# Patient Record
Sex: Male | Born: 1935 | Race: White | Hispanic: No | Marital: Married | Smoking: Never smoker
Health system: Southern US, Community
[De-identification: ages and names within clinical notes are randomized; demographics above are authoritative.]

## PROBLEM LIST (undated history)

## (undated) DIAGNOSIS — J309 Allergic rhinitis, unspecified: Secondary | ICD-10-CM

## (undated) DIAGNOSIS — I480 Paroxysmal atrial fibrillation: Secondary | ICD-10-CM

## (undated) DIAGNOSIS — I10 Essential (primary) hypertension: Secondary | ICD-10-CM

## (undated) DIAGNOSIS — E785 Hyperlipidemia, unspecified: Secondary | ICD-10-CM

## (undated) DIAGNOSIS — I509 Heart failure, unspecified: Secondary | ICD-10-CM

## (undated) DIAGNOSIS — N400 Enlarged prostate without lower urinary tract symptoms: Secondary | ICD-10-CM

## (undated) DIAGNOSIS — I2510 Atherosclerotic heart disease of native coronary artery without angina pectoris: Secondary | ICD-10-CM

## (undated) HISTORY — DX: Hyperlipidemia, unspecified: E78.5

## (undated) HISTORY — DX: Essential (primary) hypertension: I10

## (undated) HISTORY — DX: Atherosclerotic heart disease of native coronary artery without angina pectoris: I25.10

## (undated) HISTORY — DX: Paroxysmal atrial fibrillation: I48.0

## (undated) HISTORY — DX: Benign prostatic hyperplasia without lower urinary tract symptoms: N40.0

## (undated) HISTORY — DX: Allergic rhinitis, unspecified: J30.9

---
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
## 2009-04-26 DIAGNOSIS — I2510 Atherosclerotic heart disease of native coronary artery without angina pectoris: Secondary | ICD-10-CM

## 2009-04-26 HISTORY — DX: Atherosclerotic heart disease of native coronary artery without angina pectoris: I25.10

## 2009-04-26 HISTORY — PX: CARDIAC CATHETERIZATION: SHX172

## 2010-06-15 HISTORY — PX: COLONOSCOPY: SHX174

## 2011-05-04 DIAGNOSIS — L719 Rosacea, unspecified: Secondary | ICD-10-CM | POA: Diagnosis not present

## 2011-05-04 DIAGNOSIS — L570 Actinic keratosis: Secondary | ICD-10-CM | POA: Diagnosis not present

## 2011-05-04 DIAGNOSIS — L820 Inflamed seborrheic keratosis: Secondary | ICD-10-CM | POA: Diagnosis not present

## 2011-05-19 DIAGNOSIS — M5030 Other cervical disc degeneration, unspecified cervical region: Secondary | ICD-10-CM | POA: Diagnosis not present

## 2011-05-19 DIAGNOSIS — M5020 Other cervical disc displacement, unspecified cervical region: Secondary | ICD-10-CM | POA: Diagnosis not present

## 2011-05-19 DIAGNOSIS — M999 Biomechanical lesion, unspecified: Secondary | ICD-10-CM | POA: Diagnosis not present

## 2011-07-21 DIAGNOSIS — R55 Syncope and collapse: Secondary | ICD-10-CM | POA: Diagnosis not present

## 2011-07-21 DIAGNOSIS — I4891 Unspecified atrial fibrillation: Secondary | ICD-10-CM | POA: Diagnosis not present

## 2011-08-24 DIAGNOSIS — I4891 Unspecified atrial fibrillation: Secondary | ICD-10-CM | POA: Diagnosis not present

## 2011-08-25 DIAGNOSIS — M5030 Other cervical disc degeneration, unspecified cervical region: Secondary | ICD-10-CM | POA: Diagnosis not present

## 2011-08-25 DIAGNOSIS — M999 Biomechanical lesion, unspecified: Secondary | ICD-10-CM | POA: Diagnosis not present

## 2011-08-25 DIAGNOSIS — M5020 Other cervical disc displacement, unspecified cervical region: Secondary | ICD-10-CM | POA: Diagnosis not present

## 2011-08-25 DIAGNOSIS — I4891 Unspecified atrial fibrillation: Secondary | ICD-10-CM | POA: Diagnosis not present

## 2011-09-10 DIAGNOSIS — M999 Biomechanical lesion, unspecified: Secondary | ICD-10-CM | POA: Diagnosis not present

## 2011-09-10 DIAGNOSIS — M5030 Other cervical disc degeneration, unspecified cervical region: Secondary | ICD-10-CM | POA: Diagnosis not present

## 2011-09-10 DIAGNOSIS — M5020 Other cervical disc displacement, unspecified cervical region: Secondary | ICD-10-CM | POA: Diagnosis not present

## 2011-09-13 DIAGNOSIS — M79609 Pain in unspecified limb: Secondary | ICD-10-CM | POA: Diagnosis not present

## 2011-09-13 DIAGNOSIS — B351 Tinea unguium: Secondary | ICD-10-CM | POA: Diagnosis not present

## 2011-09-13 DIAGNOSIS — L600 Ingrowing nail: Secondary | ICD-10-CM | POA: Diagnosis not present

## 2011-09-13 DIAGNOSIS — L608 Other nail disorders: Secondary | ICD-10-CM | POA: Diagnosis not present

## 2011-09-15 DIAGNOSIS — H43819 Vitreous degeneration, unspecified eye: Secondary | ICD-10-CM | POA: Diagnosis not present

## 2011-09-15 DIAGNOSIS — H25049 Posterior subcapsular polar age-related cataract, unspecified eye: Secondary | ICD-10-CM | POA: Diagnosis not present

## 2011-09-15 DIAGNOSIS — H2510 Age-related nuclear cataract, unspecified eye: Secondary | ICD-10-CM | POA: Diagnosis not present

## 2011-09-15 DIAGNOSIS — H02839 Dermatochalasis of unspecified eye, unspecified eyelid: Secondary | ICD-10-CM | POA: Diagnosis not present

## 2011-09-27 DIAGNOSIS — L608 Other nail disorders: Secondary | ICD-10-CM | POA: Diagnosis not present

## 2011-09-27 DIAGNOSIS — L600 Ingrowing nail: Secondary | ICD-10-CM | POA: Diagnosis not present

## 2011-09-27 DIAGNOSIS — M79609 Pain in unspecified limb: Secondary | ICD-10-CM | POA: Diagnosis not present

## 2011-09-27 DIAGNOSIS — B351 Tinea unguium: Secondary | ICD-10-CM | POA: Diagnosis not present

## 2011-10-04 DIAGNOSIS — I498 Other specified cardiac arrhythmias: Secondary | ICD-10-CM | POA: Diagnosis not present

## 2011-10-04 DIAGNOSIS — I4891 Unspecified atrial fibrillation: Secondary | ICD-10-CM | POA: Diagnosis not present

## 2011-10-04 DIAGNOSIS — R55 Syncope and collapse: Secondary | ICD-10-CM | POA: Diagnosis not present

## 2011-10-11 DIAGNOSIS — Z6826 Body mass index (BMI) 26.0-26.9, adult: Secondary | ICD-10-CM | POA: Diagnosis not present

## 2011-10-11 DIAGNOSIS — H532 Diplopia: Secondary | ICD-10-CM | POA: Diagnosis not present

## 2011-10-11 DIAGNOSIS — H8110 Benign paroxysmal vertigo, unspecified ear: Secondary | ICD-10-CM | POA: Diagnosis not present

## 2011-10-12 DIAGNOSIS — R42 Dizziness and giddiness: Secondary | ICD-10-CM | POA: Diagnosis not present

## 2011-10-12 DIAGNOSIS — H532 Diplopia: Secondary | ICD-10-CM | POA: Diagnosis not present

## 2011-12-29 DIAGNOSIS — I4891 Unspecified atrial fibrillation: Secondary | ICD-10-CM | POA: Diagnosis not present

## 2011-12-29 DIAGNOSIS — I10 Essential (primary) hypertension: Secondary | ICD-10-CM | POA: Diagnosis not present

## 2011-12-29 DIAGNOSIS — I498 Other specified cardiac arrhythmias: Secondary | ICD-10-CM | POA: Diagnosis not present

## 2012-01-17 DIAGNOSIS — Z23 Encounter for immunization: Secondary | ICD-10-CM | POA: Diagnosis not present

## 2012-01-26 DIAGNOSIS — M999 Biomechanical lesion, unspecified: Secondary | ICD-10-CM | POA: Diagnosis not present

## 2012-01-26 DIAGNOSIS — M5020 Other cervical disc displacement, unspecified cervical region: Secondary | ICD-10-CM | POA: Diagnosis not present

## 2012-01-26 DIAGNOSIS — M5030 Other cervical disc degeneration, unspecified cervical region: Secondary | ICD-10-CM | POA: Diagnosis not present

## 2012-04-27 DIAGNOSIS — M5030 Other cervical disc degeneration, unspecified cervical region: Secondary | ICD-10-CM | POA: Diagnosis not present

## 2012-04-27 DIAGNOSIS — M542 Cervicalgia: Secondary | ICD-10-CM | POA: Diagnosis not present

## 2012-04-28 DIAGNOSIS — M542 Cervicalgia: Secondary | ICD-10-CM | POA: Diagnosis not present

## 2012-04-28 DIAGNOSIS — M5030 Other cervical disc degeneration, unspecified cervical region: Secondary | ICD-10-CM | POA: Diagnosis not present

## 2012-05-08 DIAGNOSIS — L600 Ingrowing nail: Secondary | ICD-10-CM | POA: Diagnosis not present

## 2012-05-08 DIAGNOSIS — M25473 Effusion, unspecified ankle: Secondary | ICD-10-CM | POA: Diagnosis not present

## 2012-05-08 DIAGNOSIS — M79609 Pain in unspecified limb: Secondary | ICD-10-CM | POA: Diagnosis not present

## 2012-05-08 DIAGNOSIS — M25476 Effusion, unspecified foot: Secondary | ICD-10-CM | POA: Diagnosis not present

## 2012-05-11 DIAGNOSIS — M542 Cervicalgia: Secondary | ICD-10-CM | POA: Diagnosis not present

## 2012-05-11 DIAGNOSIS — M5030 Other cervical disc degeneration, unspecified cervical region: Secondary | ICD-10-CM | POA: Diagnosis not present

## 2012-05-22 DIAGNOSIS — M25476 Effusion, unspecified foot: Secondary | ICD-10-CM | POA: Diagnosis not present

## 2012-05-22 DIAGNOSIS — M79609 Pain in unspecified limb: Secondary | ICD-10-CM | POA: Diagnosis not present

## 2012-05-22 DIAGNOSIS — M25473 Effusion, unspecified ankle: Secondary | ICD-10-CM | POA: Diagnosis not present

## 2012-05-22 DIAGNOSIS — L600 Ingrowing nail: Secondary | ICD-10-CM | POA: Diagnosis not present

## 2012-05-26 DIAGNOSIS — R002 Palpitations: Secondary | ICD-10-CM | POA: Diagnosis not present

## 2012-05-26 DIAGNOSIS — I4891 Unspecified atrial fibrillation: Secondary | ICD-10-CM | POA: Diagnosis not present

## 2012-05-26 DIAGNOSIS — I209 Angina pectoris, unspecified: Secondary | ICD-10-CM | POA: Diagnosis not present

## 2012-05-26 DIAGNOSIS — I498 Other specified cardiac arrhythmias: Secondary | ICD-10-CM | POA: Diagnosis not present

## 2012-05-26 DIAGNOSIS — N400 Enlarged prostate without lower urinary tract symptoms: Secondary | ICD-10-CM | POA: Diagnosis not present

## 2012-05-26 DIAGNOSIS — I4892 Unspecified atrial flutter: Secondary | ICD-10-CM | POA: Diagnosis not present

## 2012-05-26 DIAGNOSIS — M129 Arthropathy, unspecified: Secondary | ICD-10-CM | POA: Diagnosis not present

## 2012-05-26 DIAGNOSIS — I509 Heart failure, unspecified: Secondary | ICD-10-CM | POA: Diagnosis not present

## 2012-05-26 DIAGNOSIS — I495 Sick sinus syndrome: Secondary | ICD-10-CM | POA: Diagnosis not present

## 2012-06-05 DIAGNOSIS — M47812 Spondylosis without myelopathy or radiculopathy, cervical region: Secondary | ICD-10-CM | POA: Diagnosis not present

## 2012-06-05 DIAGNOSIS — M5030 Other cervical disc degeneration, unspecified cervical region: Secondary | ICD-10-CM | POA: Diagnosis not present

## 2012-06-05 DIAGNOSIS — I498 Other specified cardiac arrhythmias: Secondary | ICD-10-CM | POA: Diagnosis not present

## 2012-06-05 DIAGNOSIS — M542 Cervicalgia: Secondary | ICD-10-CM | POA: Diagnosis not present

## 2012-06-05 DIAGNOSIS — Z0000 Encounter for general adult medical examination without abnormal findings: Secondary | ICD-10-CM | POA: Diagnosis not present

## 2012-06-05 DIAGNOSIS — Z13220 Encounter for screening for lipoid disorders: Secondary | ICD-10-CM | POA: Diagnosis not present

## 2012-06-05 DIAGNOSIS — I4891 Unspecified atrial fibrillation: Secondary | ICD-10-CM | POA: Diagnosis not present

## 2012-06-12 DIAGNOSIS — I4891 Unspecified atrial fibrillation: Secondary | ICD-10-CM | POA: Diagnosis not present

## 2012-06-12 DIAGNOSIS — E785 Hyperlipidemia, unspecified: Secondary | ICD-10-CM | POA: Diagnosis not present

## 2012-06-12 DIAGNOSIS — I10 Essential (primary) hypertension: Secondary | ICD-10-CM | POA: Diagnosis not present

## 2012-06-19 DIAGNOSIS — M5030 Other cervical disc degeneration, unspecified cervical region: Secondary | ICD-10-CM | POA: Diagnosis not present

## 2012-06-19 DIAGNOSIS — M542 Cervicalgia: Secondary | ICD-10-CM | POA: Diagnosis not present

## 2012-07-04 DIAGNOSIS — M47812 Spondylosis without myelopathy or radiculopathy, cervical region: Secondary | ICD-10-CM | POA: Diagnosis not present

## 2012-07-26 DIAGNOSIS — M542 Cervicalgia: Secondary | ICD-10-CM | POA: Diagnosis not present

## 2012-07-26 DIAGNOSIS — M5030 Other cervical disc degeneration, unspecified cervical region: Secondary | ICD-10-CM | POA: Diagnosis not present

## 2012-08-02 DIAGNOSIS — Z125 Encounter for screening for malignant neoplasm of prostate: Secondary | ICD-10-CM | POA: Diagnosis not present

## 2012-08-02 DIAGNOSIS — Z13220 Encounter for screening for lipoid disorders: Secondary | ICD-10-CM | POA: Diagnosis not present

## 2012-08-02 DIAGNOSIS — Z0000 Encounter for general adult medical examination without abnormal findings: Secondary | ICD-10-CM | POA: Diagnosis not present

## 2012-08-02 DIAGNOSIS — N400 Enlarged prostate without lower urinary tract symptoms: Secondary | ICD-10-CM | POA: Diagnosis not present

## 2012-08-02 DIAGNOSIS — I10 Essential (primary) hypertension: Secondary | ICD-10-CM | POA: Diagnosis not present

## 2012-08-02 DIAGNOSIS — I4891 Unspecified atrial fibrillation: Secondary | ICD-10-CM | POA: Diagnosis not present

## 2012-08-23 DIAGNOSIS — I4891 Unspecified atrial fibrillation: Secondary | ICD-10-CM | POA: Diagnosis not present

## 2012-08-23 DIAGNOSIS — M47812 Spondylosis without myelopathy or radiculopathy, cervical region: Secondary | ICD-10-CM | POA: Diagnosis not present

## 2012-08-23 DIAGNOSIS — M5030 Other cervical disc degeneration, unspecified cervical region: Secondary | ICD-10-CM | POA: Diagnosis not present

## 2012-08-23 DIAGNOSIS — M542 Cervicalgia: Secondary | ICD-10-CM | POA: Diagnosis not present

## 2012-08-23 HISTORY — PX: RADIOFREQUENCY ABLATION: SHX2290

## 2012-09-04 DIAGNOSIS — Z111 Encounter for screening for respiratory tuberculosis: Secondary | ICD-10-CM | POA: Diagnosis not present

## 2012-09-06 DIAGNOSIS — M542 Cervicalgia: Secondary | ICD-10-CM | POA: Diagnosis not present

## 2012-09-06 DIAGNOSIS — M5030 Other cervical disc degeneration, unspecified cervical region: Secondary | ICD-10-CM | POA: Diagnosis not present

## 2012-09-11 DIAGNOSIS — I4891 Unspecified atrial fibrillation: Secondary | ICD-10-CM | POA: Diagnosis not present

## 2012-09-11 DIAGNOSIS — I10 Essential (primary) hypertension: Secondary | ICD-10-CM | POA: Diagnosis not present

## 2012-09-11 DIAGNOSIS — E7800 Pure hypercholesterolemia, unspecified: Secondary | ICD-10-CM | POA: Diagnosis not present

## 2012-12-28 DIAGNOSIS — M542 Cervicalgia: Secondary | ICD-10-CM | POA: Diagnosis not present

## 2012-12-28 DIAGNOSIS — M25519 Pain in unspecified shoulder: Secondary | ICD-10-CM | POA: Diagnosis not present

## 2013-01-10 ENCOUNTER — Emergency Department (HOSPITAL_COMMUNITY)
Admission: EM | Admit: 2013-01-10 | Discharge: 2013-01-10 | Disposition: A | Discharge status: Home / Self Care | Payer: Medicare Other | Attending: Emergency Medicine | Admitting: Emergency Medicine

## 2013-01-10 ENCOUNTER — Encounter (HOSPITAL_COMMUNITY): Payer: Self-pay | Admitting: *Deleted

## 2013-01-10 ENCOUNTER — Emergency Department (HOSPITAL_COMMUNITY): Payer: Medicare Other

## 2013-01-10 DIAGNOSIS — Z7982 Long term (current) use of aspirin: Secondary | ICD-10-CM | POA: Insufficient documentation

## 2013-01-10 DIAGNOSIS — I4891 Unspecified atrial fibrillation: Secondary | ICD-10-CM | POA: Insufficient documentation

## 2013-01-10 DIAGNOSIS — I509 Heart failure, unspecified: Secondary | ICD-10-CM | POA: Insufficient documentation

## 2013-01-10 DIAGNOSIS — Z79899 Other long term (current) drug therapy: Secondary | ICD-10-CM | POA: Insufficient documentation

## 2013-01-10 DIAGNOSIS — R072 Precordial pain: Secondary | ICD-10-CM | POA: Insufficient documentation

## 2013-01-10 DIAGNOSIS — R002 Palpitations: Secondary | ICD-10-CM | POA: Insufficient documentation

## 2013-01-10 DIAGNOSIS — R079 Chest pain, unspecified: Secondary | ICD-10-CM

## 2013-01-10 HISTORY — DX: Heart failure, unspecified: I50.9

## 2013-01-10 LAB — CBC WITH DIFFERENTIAL/PLATELET
Basophils Relative: 0 % (ref 0–1)
HCT: 40.4 % (ref 39.0–52.0)
Hemoglobin: 14 g/dL (ref 13.0–17.0)
Lymphocytes Relative: 31 % (ref 12–46)
MCHC: 34.7 g/dL (ref 30.0–36.0)
Monocytes Relative: 10 % (ref 3–12)
Neutro Abs: 3.1 10*3/uL (ref 1.7–7.7)
Neutrophils Relative %: 58 % (ref 43–77)
RBC: 4.48 MIL/uL (ref 4.22–5.81)
WBC: 5.4 10*3/uL (ref 4.0–10.5)

## 2013-01-10 LAB — TROPONIN I: Troponin I: <0.3 ng/mL (ref <0.30)

## 2013-01-10 LAB — COMPREHENSIVE METABOLIC PANEL
AST: 26 U/L (ref 0–37)
Albumin: 4.1 g/dL (ref 3.5–5.2)
Alkaline Phosphatase: 67 U/L (ref 39–117)
BUN: 24 mg/dL — ABNORMAL HIGH (ref 6–23)
CO2: 29 mEq/L (ref 19–32)
Chloride: 101 mEq/L (ref 96–112)
GFR calc non Af Amer: 60 mL/min — ABNORMAL LOW (ref >90)
Potassium: 3.9 mEq/L (ref 3.5–5.1)
Total Bilirubin: 0.3 mg/dL (ref 0.3–1.2)

## 2013-01-10 NOTE — ED Notes (Signed)
MD at bedside. 

## 2013-01-10 NOTE — ED Notes (Signed)
2 hours ago the pt was having some chest tithtness and his heart was racing.  Hx of the same.  No pain at present and his heart has slowed down

## 2013-01-10 NOTE — ED Provider Notes (Signed)
CSN: [id number]     Arrival date & time 01/10/13  2101 History   First MD Initiated Contact with Patient 01/10/13 2153     Chief Complaint  Patient presents with  . Chest Pain   (Consider location/radiation/quality/duration/timing/severity/associated sxs/prior Treatment) Patient is a 79 y.o. male presenting with chest pain. The history is provided by the patient.  Chest Pain Pain location:  Substernal area Pain quality comment:  Tightness Pain radiates to:  Does not radiate Pain radiates to the back: no   Pain severity:  Mild Onset quality:  Sudden Duration:  30 minutes Timing:  Constant Progression:  Resolved Chronicity:  New Context: at rest   Relieved by:  Nothing Worsened by:  Nothing tried Ineffective treatments:  None tried Associated symptoms: palpitations   Associated symptoms: no abdominal pain, no anxiety, no back pain, no cough, no diaphoresis, no dizziness, no dysphagia, no fatigue, no fever, no headache, no nausea, no near-syncope, no numbness, no shortness of breath, not vomiting and no weakness   Associated symptoms comment:  2 hours of tachy, irregular palpitations Risk factors: male sex   Risk factors: no coronary artery disease, no diabetes mellitus, no high cholesterol, no hypertension and no smoking     Past Medical History  Diagnosis Date  . CHF (congestive heart failure)   . AF (atrial fibrillation)    History reviewed. No pertinent past surgical history. History reviewed. No pertinent family history. History  Substance Use Topics  . Smoking status: Never Smoker   . Smokeless tobacco: Not on file  . Alcohol Use: Yes    Review of Systems  Constitutional: Negative for fever, diaphoresis, activity change, appetite change and fatigue.  HENT: Negative for congestion, facial swelling, rhinorrhea and trouble swallowing.   Eyes: Negative for photophobia and pain.  Respiratory: Negative for cough, chest tightness and shortness of breath.    Cardiovascular: Positive for chest pain and palpitations. Negative for leg swelling and near-syncope.  Gastrointestinal: Negative for nausea, vomiting, abdominal pain, diarrhea and constipation.  Endocrine: Negative for polydipsia and polyuria.  Genitourinary: Negative for dysuria, urgency, decreased urine volume and difficulty urinating.  Musculoskeletal: Negative for back pain and gait problem.  Skin: Negative for color change, rash and wound.  Allergic/Immunologic: Negative for immunocompromised state.  Neurological: Negative for dizziness, facial asymmetry, speech difficulty, weakness, numbness and headaches.  Psychiatric/Behavioral: Negative for confusion, decreased concentration and agitation.    Allergies  Keflex  Home Medications   Current Outpatient Rx  Name  Route  Sig  Dispense  Refill  . aspirin EC 325 MG tablet   Oral   Take 325 mg by mouth daily.         . B Complex-C (SUPER B COMPLEX/VITAMIN C) TABS   Oral   Take 1 tablet by mouth daily.         . carboxymethylcellulose 1 % ophthalmic solution   Ophthalmic   Apply 1 drop to eye every morning.         . cetirizine (ZYRTEC) 10 MG tablet   Oral   Take 10 mg by mouth daily.         . cyclobenzaprine (FLEXERIL) 10 MG tablet   Oral   Take 10 mg by mouth at bedtime.         . diphenhydramine-acetaminophen (TYLENOL PM) 25-500 MG TABS   Oral   Take 2 tablets by mouth at bedtime.         . docusate sodium (COLACE) 100 MG capsule  Oral   Take 100 mg by mouth 2 (two) times daily.         . finasteride (PROSCAR) 5 MG tablet   Oral   Take 5 mg by mouth daily.         . hydrocortisone cream 1 %   Topical   Apply 1 application topically daily.         . magnesium oxide (MAG-OX) 400 MG tablet   Oral   Take 400 mg by mouth 2 (two) times daily.         . Multiple Vitamin (MULTIVITAMIN WITH MINERALS) TABS tablet   Oral   Take 1 tablet by mouth daily.         . naproxen sodium (ANAPROX)  220 MG tablet   Oral   Take 220 mg by mouth daily.         . potassium gluconate 595 MG TABS tablet   Oral   Take 595 mg by mouth daily.         . sotalol (BETAPACE) 80 MG tablet   Oral   Take 40 mg by mouth 2 (two) times daily.          BP 122/67  Pulse 64  Temp(Src) 97.7 F (36.5 C) (Oral)  Resp 17  SpO2 98% Physical Exam  Constitutional: He is oriented to person, place, and time. He appears well-developed and well-nourished. No distress.  HENT:  Head: Normocephalic and atraumatic.  Mouth/Throat: No oropharyngeal exudate.  Eyes: Pupils are equal, round, and reactive to light.  Neck: Normal range of motion. Neck supple.  Cardiovascular: Normal rate, regular rhythm and normal heart sounds.  Exam reveals no gallop and no friction rub.   No murmur heard. Pulmonary/Chest: Effort normal and breath sounds normal. No respiratory distress. He has no wheezes. He has no rales.  Abdominal: Soft. Bowel sounds are normal. He exhibits no distension and no mass. There is no tenderness. There is no rebound and no guarding.  Musculoskeletal: Normal range of motion. He exhibits no edema and no tenderness.  Neurological: He is alert and oriented to person, place, and time.  Skin: Skin is warm and dry.  Psychiatric: He has a normal mood and affect.    ED Course  Procedures (including critical care time) Labs Review Labs Reviewed  COMPREHENSIVE METABOLIC PANEL - Abnormal; Notable for the following:    Glucose, Bld 128 (*)    BUN 24 (*)    GFR calc non Af Amer 60 (*)    GFR calc Af Amer 69 (*)    All other components within normal limits  CBC WITH DIFFERENTIAL  TROPONIN I   Imaging Review Dg Chest 2 View  01/10/2013   CLINICAL DATA:  Chest tightness. History of congestive heart failure.  EXAM: CHEST  2 VIEW  COMPARISON:  No priors.  FINDINGS: Normal lung volumes. No acute consolidative airspace disease. No pleural effusions. No evidence of pulmonary edema. Heart size is normal.  Mediastinal contours are unremarkable. Atherosclerosis in the thoracic aorta. Mild bilateral apical pleural parenchymal thickening, presumably post infectious or inflammatory scarring.  IMPRESSION: 1. No radiographic evidence of acute cardiopulmonary disease. 2. Atherosclerosis.   Electronically Signed   By: Trudie Reed M.D.   On: 01/10/2013 22:53     Date: 01/10/2013  Rate: 72  Rhythm: normal sinus rhythm  QRS Axis: normal  Intervals: normal  ST/T Wave abnormalities: normal  Conduction Disutrbances: none  Narrative Interpretation: likely old anterolateral infarct w/ precordial Q waves.  RA enlargement. No prior seen.     MDM   1. Chest pain   2. Palpitations    Pt is a 79 y.o. male with Pmhx as above who presents with about 2 hrs of tachy arrythmia and 30 mins of CP, beginning around 7pm tonight, now resolved.  Hx of afib with similar symptoms in past, though not typically with associated CP.  He also reports having heart cather several years ago with greatest blockage being 20%.  EKG NSR as above, trop not elevated, CXR unremarkable.  Pt now has no complaints, has not had reoccurrence of symptoms, and would like to go home.  Have spoken with cardiology, Dr. Chase Picket about pt's hx, course and w/u, he agrees that he should be safe for d/c, but need to see his cardiologist, Dr. Mayford Knife in 1-2 days.  Pt will return or ED for symptom reoccurrence.     1. Chest pain   2. Palpitations         Shanna Cisco, MD 01/11/13 [phone]

## 2013-03-01 DIAGNOSIS — D235 Other benign neoplasm of skin of trunk: Secondary | ICD-10-CM | POA: Diagnosis not present

## 2013-03-01 DIAGNOSIS — L570 Actinic keratosis: Secondary | ICD-10-CM | POA: Diagnosis not present

## 2013-03-08 ENCOUNTER — Telehealth: Payer: Self-pay | Admitting: *Deleted

## 2013-03-08 MED ORDER — SOTALOL HCL 80 MG PO TABS: 40.0000 mg | ORAL_TABLET | Freq: Two times a day (BID) | ORAL | Status: DC

## 2013-03-08 NOTE — Telephone Encounter (Signed)
Refill request for sotalol hcl 80mg , 0.5 tab bid to be sent to [facility] pharmacy.

## 2013-03-08 NOTE — Telephone Encounter (Signed)
Rx sent in for pt.

## 2013-05-29 DIAGNOSIS — K5900 Constipation, unspecified: Secondary | ICD-10-CM | POA: Diagnosis not present

## 2013-05-29 DIAGNOSIS — J309 Allergic rhinitis, unspecified: Secondary | ICD-10-CM | POA: Diagnosis not present

## 2013-08-01 DIAGNOSIS — M9981 Other biomechanical lesions of cervical region: Secondary | ICD-10-CM | POA: Diagnosis not present

## 2013-08-01 DIAGNOSIS — M545 Low back pain, unspecified: Secondary | ICD-10-CM | POA: Diagnosis not present

## 2013-08-01 DIAGNOSIS — M5030 Other cervical disc degeneration, unspecified cervical region: Secondary | ICD-10-CM | POA: Diagnosis not present

## 2013-08-01 DIAGNOSIS — M999 Biomechanical lesion, unspecified: Secondary | ICD-10-CM | POA: Diagnosis not present

## 2013-08-02 DIAGNOSIS — M545 Low back pain, unspecified: Secondary | ICD-10-CM | POA: Diagnosis not present

## 2013-08-02 DIAGNOSIS — M999 Biomechanical lesion, unspecified: Secondary | ICD-10-CM | POA: Diagnosis not present

## 2013-08-02 DIAGNOSIS — M5030 Other cervical disc degeneration, unspecified cervical region: Secondary | ICD-10-CM | POA: Diagnosis not present

## 2013-08-02 DIAGNOSIS — M9981 Other biomechanical lesions of cervical region: Secondary | ICD-10-CM | POA: Diagnosis not present

## 2013-08-07 DIAGNOSIS — M9981 Other biomechanical lesions of cervical region: Secondary | ICD-10-CM | POA: Diagnosis not present

## 2013-08-07 DIAGNOSIS — M5030 Other cervical disc degeneration, unspecified cervical region: Secondary | ICD-10-CM | POA: Diagnosis not present

## 2013-08-07 DIAGNOSIS — M545 Low back pain, unspecified: Secondary | ICD-10-CM | POA: Diagnosis not present

## 2013-08-07 DIAGNOSIS — M999 Biomechanical lesion, unspecified: Secondary | ICD-10-CM | POA: Diagnosis not present

## 2013-08-10 DIAGNOSIS — M999 Biomechanical lesion, unspecified: Secondary | ICD-10-CM | POA: Diagnosis not present

## 2013-08-10 DIAGNOSIS — M545 Low back pain, unspecified: Secondary | ICD-10-CM | POA: Diagnosis not present

## 2013-08-10 DIAGNOSIS — M9981 Other biomechanical lesions of cervical region: Secondary | ICD-10-CM | POA: Diagnosis not present

## 2013-08-10 DIAGNOSIS — M5030 Other cervical disc degeneration, unspecified cervical region: Secondary | ICD-10-CM | POA: Diagnosis not present

## 2013-08-13 DIAGNOSIS — M999 Biomechanical lesion, unspecified: Secondary | ICD-10-CM | POA: Diagnosis not present

## 2013-08-13 DIAGNOSIS — M545 Low back pain, unspecified: Secondary | ICD-10-CM | POA: Diagnosis not present

## 2013-08-13 DIAGNOSIS — M9981 Other biomechanical lesions of cervical region: Secondary | ICD-10-CM | POA: Diagnosis not present

## 2013-08-13 DIAGNOSIS — M5030 Other cervical disc degeneration, unspecified cervical region: Secondary | ICD-10-CM | POA: Diagnosis not present

## 2013-08-17 DIAGNOSIS — M9981 Other biomechanical lesions of cervical region: Secondary | ICD-10-CM | POA: Diagnosis not present

## 2013-08-17 DIAGNOSIS — M545 Low back pain, unspecified: Secondary | ICD-10-CM | POA: Diagnosis not present

## 2013-08-17 DIAGNOSIS — M5030 Other cervical disc degeneration, unspecified cervical region: Secondary | ICD-10-CM | POA: Diagnosis not present

## 2013-08-17 DIAGNOSIS — M999 Biomechanical lesion, unspecified: Secondary | ICD-10-CM | POA: Diagnosis not present

## 2013-08-21 DIAGNOSIS — M545 Low back pain, unspecified: Secondary | ICD-10-CM | POA: Diagnosis not present

## 2013-08-21 DIAGNOSIS — M5030 Other cervical disc degeneration, unspecified cervical region: Secondary | ICD-10-CM | POA: Diagnosis not present

## 2013-08-21 DIAGNOSIS — M999 Biomechanical lesion, unspecified: Secondary | ICD-10-CM | POA: Diagnosis not present

## 2013-08-21 DIAGNOSIS — M9981 Other biomechanical lesions of cervical region: Secondary | ICD-10-CM | POA: Diagnosis not present

## 2013-08-22 DIAGNOSIS — L570 Actinic keratosis: Secondary | ICD-10-CM | POA: Diagnosis not present

## 2013-08-22 DIAGNOSIS — D235 Other benign neoplasm of skin of trunk: Secondary | ICD-10-CM | POA: Diagnosis not present

## 2013-08-24 DIAGNOSIS — M5030 Other cervical disc degeneration, unspecified cervical region: Secondary | ICD-10-CM | POA: Diagnosis not present

## 2013-08-24 DIAGNOSIS — M9981 Other biomechanical lesions of cervical region: Secondary | ICD-10-CM | POA: Diagnosis not present

## 2013-08-24 DIAGNOSIS — M545 Low back pain, unspecified: Secondary | ICD-10-CM | POA: Diagnosis not present

## 2013-08-24 DIAGNOSIS — M999 Biomechanical lesion, unspecified: Secondary | ICD-10-CM | POA: Diagnosis not present

## 2013-08-27 DIAGNOSIS — J309 Allergic rhinitis, unspecified: Secondary | ICD-10-CM | POA: Diagnosis not present

## 2013-08-28 DIAGNOSIS — M9981 Other biomechanical lesions of cervical region: Secondary | ICD-10-CM | POA: Diagnosis not present

## 2013-08-28 DIAGNOSIS — M5030 Other cervical disc degeneration, unspecified cervical region: Secondary | ICD-10-CM | POA: Diagnosis not present

## 2013-08-28 DIAGNOSIS — M545 Low back pain, unspecified: Secondary | ICD-10-CM | POA: Diagnosis not present

## 2013-08-28 DIAGNOSIS — M999 Biomechanical lesion, unspecified: Secondary | ICD-10-CM | POA: Diagnosis not present

## 2013-08-31 DIAGNOSIS — M9981 Other biomechanical lesions of cervical region: Secondary | ICD-10-CM | POA: Diagnosis not present

## 2013-08-31 DIAGNOSIS — M999 Biomechanical lesion, unspecified: Secondary | ICD-10-CM | POA: Diagnosis not present

## 2013-08-31 DIAGNOSIS — M545 Low back pain, unspecified: Secondary | ICD-10-CM | POA: Diagnosis not present

## 2013-08-31 DIAGNOSIS — M5030 Other cervical disc degeneration, unspecified cervical region: Secondary | ICD-10-CM | POA: Diagnosis not present

## 2013-09-07 DIAGNOSIS — M9981 Other biomechanical lesions of cervical region: Secondary | ICD-10-CM | POA: Diagnosis not present

## 2013-09-07 DIAGNOSIS — M545 Low back pain, unspecified: Secondary | ICD-10-CM | POA: Diagnosis not present

## 2013-09-07 DIAGNOSIS — M999 Biomechanical lesion, unspecified: Secondary | ICD-10-CM | POA: Diagnosis not present

## 2013-09-07 DIAGNOSIS — M5030 Other cervical disc degeneration, unspecified cervical region: Secondary | ICD-10-CM | POA: Diagnosis not present

## 2013-09-11 DIAGNOSIS — M999 Biomechanical lesion, unspecified: Secondary | ICD-10-CM | POA: Diagnosis not present

## 2013-09-11 DIAGNOSIS — M5030 Other cervical disc degeneration, unspecified cervical region: Secondary | ICD-10-CM | POA: Diagnosis not present

## 2013-09-11 DIAGNOSIS — M9981 Other biomechanical lesions of cervical region: Secondary | ICD-10-CM | POA: Diagnosis not present

## 2013-09-11 DIAGNOSIS — M545 Low back pain, unspecified: Secondary | ICD-10-CM | POA: Diagnosis not present

## 2013-09-18 DIAGNOSIS — M9981 Other biomechanical lesions of cervical region: Secondary | ICD-10-CM | POA: Diagnosis not present

## 2013-09-18 DIAGNOSIS — M999 Biomechanical lesion, unspecified: Secondary | ICD-10-CM | POA: Diagnosis not present

## 2013-09-18 DIAGNOSIS — M5030 Other cervical disc degeneration, unspecified cervical region: Secondary | ICD-10-CM | POA: Diagnosis not present

## 2013-09-18 DIAGNOSIS — M545 Low back pain, unspecified: Secondary | ICD-10-CM | POA: Diagnosis not present

## 2013-09-26 DIAGNOSIS — M5030 Other cervical disc degeneration, unspecified cervical region: Secondary | ICD-10-CM | POA: Diagnosis not present

## 2013-09-26 DIAGNOSIS — M999 Biomechanical lesion, unspecified: Secondary | ICD-10-CM | POA: Diagnosis not present

## 2013-09-26 DIAGNOSIS — M9981 Other biomechanical lesions of cervical region: Secondary | ICD-10-CM | POA: Diagnosis not present

## 2013-09-26 DIAGNOSIS — M545 Low back pain, unspecified: Secondary | ICD-10-CM | POA: Diagnosis not present

## 2013-10-01 DIAGNOSIS — M999 Biomechanical lesion, unspecified: Secondary | ICD-10-CM | POA: Diagnosis not present

## 2013-10-01 DIAGNOSIS — M9981 Other biomechanical lesions of cervical region: Secondary | ICD-10-CM | POA: Diagnosis not present

## 2013-10-01 DIAGNOSIS — M545 Low back pain, unspecified: Secondary | ICD-10-CM | POA: Diagnosis not present

## 2013-10-01 DIAGNOSIS — M5030 Other cervical disc degeneration, unspecified cervical region: Secondary | ICD-10-CM | POA: Diagnosis not present

## 2013-10-08 DIAGNOSIS — M545 Low back pain, unspecified: Secondary | ICD-10-CM | POA: Diagnosis not present

## 2013-10-08 DIAGNOSIS — M5030 Other cervical disc degeneration, unspecified cervical region: Secondary | ICD-10-CM | POA: Diagnosis not present

## 2013-10-08 DIAGNOSIS — M9981 Other biomechanical lesions of cervical region: Secondary | ICD-10-CM | POA: Diagnosis not present

## 2013-10-08 DIAGNOSIS — M999 Biomechanical lesion, unspecified: Secondary | ICD-10-CM | POA: Diagnosis not present

## 2013-10-17 DIAGNOSIS — M999 Biomechanical lesion, unspecified: Secondary | ICD-10-CM | POA: Diagnosis not present

## 2013-10-17 DIAGNOSIS — M9981 Other biomechanical lesions of cervical region: Secondary | ICD-10-CM | POA: Diagnosis not present

## 2013-10-17 DIAGNOSIS — M5030 Other cervical disc degeneration, unspecified cervical region: Secondary | ICD-10-CM | POA: Diagnosis not present

## 2013-10-17 DIAGNOSIS — M545 Low back pain, unspecified: Secondary | ICD-10-CM | POA: Diagnosis not present

## 2013-10-31 DIAGNOSIS — M9981 Other biomechanical lesions of cervical region: Secondary | ICD-10-CM | POA: Diagnosis not present

## 2013-10-31 DIAGNOSIS — M545 Low back pain, unspecified: Secondary | ICD-10-CM | POA: Diagnosis not present

## 2013-10-31 DIAGNOSIS — M5030 Other cervical disc degeneration, unspecified cervical region: Secondary | ICD-10-CM | POA: Diagnosis not present

## 2013-10-31 DIAGNOSIS — M999 Biomechanical lesion, unspecified: Secondary | ICD-10-CM | POA: Diagnosis not present

## 2013-11-07 DIAGNOSIS — M545 Low back pain, unspecified: Secondary | ICD-10-CM | POA: Diagnosis not present

## 2013-11-07 DIAGNOSIS — M999 Biomechanical lesion, unspecified: Secondary | ICD-10-CM | POA: Diagnosis not present

## 2013-11-07 DIAGNOSIS — M5030 Other cervical disc degeneration, unspecified cervical region: Secondary | ICD-10-CM | POA: Diagnosis not present

## 2013-11-07 DIAGNOSIS — M9981 Other biomechanical lesions of cervical region: Secondary | ICD-10-CM | POA: Diagnosis not present

## 2013-11-14 DIAGNOSIS — M545 Low back pain, unspecified: Secondary | ICD-10-CM | POA: Diagnosis not present

## 2013-11-14 DIAGNOSIS — M9981 Other biomechanical lesions of cervical region: Secondary | ICD-10-CM | POA: Diagnosis not present

## 2013-11-14 DIAGNOSIS — M999 Biomechanical lesion, unspecified: Secondary | ICD-10-CM | POA: Diagnosis not present

## 2013-11-14 DIAGNOSIS — M5030 Other cervical disc degeneration, unspecified cervical region: Secondary | ICD-10-CM | POA: Diagnosis not present

## 2013-11-20 DIAGNOSIS — Z23 Encounter for immunization: Secondary | ICD-10-CM | POA: Diagnosis not present

## 2013-11-20 DIAGNOSIS — I4891 Unspecified atrial fibrillation: Secondary | ICD-10-CM | POA: Diagnosis not present

## 2013-11-20 DIAGNOSIS — J309 Allergic rhinitis, unspecified: Secondary | ICD-10-CM | POA: Diagnosis not present

## 2013-11-20 DIAGNOSIS — Z0000 Encounter for general adult medical examination without abnormal findings: Secondary | ICD-10-CM | POA: Diagnosis not present

## 2013-11-21 DIAGNOSIS — M545 Low back pain, unspecified: Secondary | ICD-10-CM | POA: Diagnosis not present

## 2013-11-21 DIAGNOSIS — M9981 Other biomechanical lesions of cervical region: Secondary | ICD-10-CM | POA: Diagnosis not present

## 2013-11-21 DIAGNOSIS — M999 Biomechanical lesion, unspecified: Secondary | ICD-10-CM | POA: Diagnosis not present

## 2013-11-21 DIAGNOSIS — M5030 Other cervical disc degeneration, unspecified cervical region: Secondary | ICD-10-CM | POA: Diagnosis not present

## 2013-11-22 DIAGNOSIS — E876 Hypokalemia: Secondary | ICD-10-CM | POA: Diagnosis not present

## 2013-12-06 DIAGNOSIS — M545 Low back pain, unspecified: Secondary | ICD-10-CM | POA: Diagnosis not present

## 2013-12-06 DIAGNOSIS — M999 Biomechanical lesion, unspecified: Secondary | ICD-10-CM | POA: Diagnosis not present

## 2013-12-06 DIAGNOSIS — M5030 Other cervical disc degeneration, unspecified cervical region: Secondary | ICD-10-CM | POA: Diagnosis not present

## 2013-12-06 DIAGNOSIS — M9981 Other biomechanical lesions of cervical region: Secondary | ICD-10-CM | POA: Diagnosis not present

## 2013-12-11 DIAGNOSIS — M999 Biomechanical lesion, unspecified: Secondary | ICD-10-CM | POA: Diagnosis not present

## 2013-12-11 DIAGNOSIS — M9981 Other biomechanical lesions of cervical region: Secondary | ICD-10-CM | POA: Diagnosis not present

## 2013-12-11 DIAGNOSIS — M5030 Other cervical disc degeneration, unspecified cervical region: Secondary | ICD-10-CM | POA: Diagnosis not present

## 2013-12-11 DIAGNOSIS — M545 Low back pain, unspecified: Secondary | ICD-10-CM | POA: Diagnosis not present

## 2013-12-21 ENCOUNTER — Encounter: Payer: Self-pay | Admitting: General Surgery

## 2013-12-21 DIAGNOSIS — I4891 Unspecified atrial fibrillation: Secondary | ICD-10-CM

## 2014-01-03 ENCOUNTER — Ambulatory Visit (INDEPENDENT_AMBULATORY_CARE_PROVIDER_SITE_OTHER): Payer: Medicare Other | Admitting: Cardiology

## 2014-01-03 ENCOUNTER — Telehealth: Payer: Self-pay | Admitting: Cardiology

## 2014-01-03 ENCOUNTER — Encounter: Payer: Self-pay | Admitting: Cardiology

## 2014-01-03 VITALS — BP 142/82 | HR 59 | Ht 68.0 in | Wt 174.8 lb

## 2014-01-03 DIAGNOSIS — I2510 Atherosclerotic heart disease of native coronary artery without angina pectoris: Secondary | ICD-10-CM | POA: Diagnosis not present

## 2014-01-03 DIAGNOSIS — I4891 Unspecified atrial fibrillation: Secondary | ICD-10-CM | POA: Diagnosis not present

## 2014-01-03 DIAGNOSIS — I480 Paroxysmal atrial fibrillation: Secondary | ICD-10-CM | POA: Insufficient documentation

## 2014-01-03 DIAGNOSIS — E785 Hyperlipidemia, unspecified: Secondary | ICD-10-CM | POA: Diagnosis not present

## 2014-01-03 MED ORDER — ASPIRIN EC 81 MG PO TBEC: 81.0000 mg | DELAYED_RELEASE_TABLET | Freq: Every day | ORAL | Status: DC

## 2014-01-03 NOTE — Progress Notes (Signed)
[facility], [address] [place], [place]  [address] Phone: [phone] Fax:  [phone]  Date:  01/03/2014   ID:  George Cain, DOB [birth year], MRN [id number]  PCP:  Vena Austria, MD  Cardiologist:  Fransico Him, MD     History of Present Illness: George Cain is a 80 y.o. male with a history of PAF s/p RFA 07/2012 and nonobstructive ASCAD with 20% LAD by cath  And dyslipidemia who presents today for followup.  He is doing well.  He denies any chest pain, SOB, DOE, LE edema, dizziness, palpitations or syncope.   Wt Readings from Last 3 Encounters:  No data found for Wt     Past Medical History  Diagnosis Date  . CHF (congestive heart failure)   . Allergic rhinitis   . BPH (benign prostatic hypertrophy)   . PAF (paroxysmal atrial fibrillation)   . Hyperlipidemia   . Coronary artery disease 2011    nonobstructive ASCAD with 20% LAD s    Current Outpatient Prescriptions  Medication Sig Dispense Refill  . aspirin EC 325 MG tablet Take 325 mg by mouth daily.      . B Complex-C (SUPER B COMPLEX/VITAMIN C) TABS Take 1 tablet by mouth daily.      . carboxymethylcellulose 1 % ophthalmic solution Apply 1 drop to eye every morning.      . cetirizine (ZYRTEC) 10 MG tablet Take 10 mg by mouth daily.      . cyclobenzaprine (FLEXERIL) 10 MG tablet Take 10 mg by mouth 3 (three) times daily as needed.       . diphenhydramine-acetaminophen (TYLENOL PM) 25-500 MG TABS Take 2 tablets by mouth at bedtime.      . docusate sodium (COLACE) 100 MG capsule Take 100 mg by mouth daily.       . finasteride (PROSCAR) 5 MG tablet Take 5 mg by mouth daily.      . flunisolide (NASALIDE) 25 MCG/ACT (0.025%) SOLN Place 2 sprays into the nose 2 (two) times daily.      . fluticasone (FLONASE) 50 MCG/ACT nasal spray Place 2 sprays into both nostrils daily.      . hydrocortisone cream 1 % Apply 1 application topically daily.      . magnesium oxide (MAG-OX) 400 MG tablet Take 400 mg by mouth 2  (two) times daily.      . montelukast (SINGULAIR) 10 MG tablet Take 10 mg by mouth at bedtime.      . Multiple Vitamin (MULTIVITAMIN WITH MINERALS) TABS tablet Take 1 tablet by mouth daily.      . naproxen sodium (ANAPROX) 220 MG tablet Take 220 mg by mouth daily.      . potassium gluconate 595 MG TABS tablet Take 75 mg by mouth daily.       . sotalol (BETAPACE) 80 MG tablet Take 0.5 tablets (40 mg total) by mouth 2 (two) times daily.  30 tablet  11   No current facility-administered medications for this visit.    Allergies:    Allergies  Allergen Reactions  . Keflex [Cephalexin] Diarrhea, Nausea Only and Other (See Comments)    Night sweats also    Social History:  The patient  reports that he has never smoked. He does not have any smokeless tobacco history on file. He reports that he does not drink alcohol or use illicit drugs.   Family History:  The patient's family history includes CAD in his father; Diabetes in his father; Heart attack  in his father; Hypertension in his mother; Prostate cancer in his father.   ROS:  Please see the history of present illness.      All other systems reviewed and negative.   PHYSICAL EXAM: VS:  There were no vitals taken for this visit. Well nourished, well developed, in no acute distress HEENT: normal Neck: no JVD Cardiac:  normal S1, S2; RRR; no murmur Lungs:  clear to auscultation bilaterally, no wheezing, rhonchi or rales Abd: soft, nontender, no hepatomegaly Ext: no edema Skin: warm and dry Neuro:  CNs 2-12 intact, no focal abnormalities noted  EKG: sinus bradycardia at 59bpm with no ST changes.  QTC 432msec   ASSESSMENT AND PLAN:  1. PAF s/p FR ablation maintaining NSR on Sotolol.  QTC ok on EKG.  I will get the old records of his ablation 2. Nonobstructive ASCAD with no angina - I will try to get the records from [place] - decrease ASA to 81mg  daily 3. Dyslipidemia - last LDL was 106 and HDL 71.  He currently is not on any statin.   I will review his cath report first  Followup with me in 1 year  Signed, Fransico Him, MD 01/03/2014 10:33 AM

## 2014-01-03 NOTE — Telephone Encounter (Signed)
[facility]I Faxed to [facility] @ [phone]   9.10.15/km

## 2014-01-03 NOTE — Patient Instructions (Signed)
Your physician has recommended you make the following change in your medication: 1. Decrease Aspirin to 81 MG daily  Your physician wants you to follow-up in: 1 year with Dr Mallie Snooks will receive a reminder letter in the mail two months in advance. If you don't receive a letter, please call our office to schedule the follow-up appointment.

## 2014-01-08 DIAGNOSIS — M999 Biomechanical lesion, unspecified: Secondary | ICD-10-CM | POA: Diagnosis not present

## 2014-01-08 DIAGNOSIS — M5030 Other cervical disc degeneration, unspecified cervical region: Secondary | ICD-10-CM | POA: Diagnosis not present

## 2014-01-08 DIAGNOSIS — M545 Low back pain, unspecified: Secondary | ICD-10-CM | POA: Diagnosis not present

## 2014-01-08 DIAGNOSIS — M9981 Other biomechanical lesions of cervical region: Secondary | ICD-10-CM | POA: Diagnosis not present

## 2014-01-09 DIAGNOSIS — L820 Inflamed seborrheic keratosis: Secondary | ICD-10-CM | POA: Diagnosis not present

## 2014-01-09 DIAGNOSIS — L570 Actinic keratosis: Secondary | ICD-10-CM | POA: Diagnosis not present

## 2014-01-10 ENCOUNTER — Encounter: Payer: Self-pay | Admitting: Cardiology

## 2014-01-15 DIAGNOSIS — M5030 Other cervical disc degeneration, unspecified cervical region: Secondary | ICD-10-CM | POA: Diagnosis not present

## 2014-01-15 DIAGNOSIS — M999 Biomechanical lesion, unspecified: Secondary | ICD-10-CM | POA: Diagnosis not present

## 2014-01-15 DIAGNOSIS — M545 Low back pain, unspecified: Secondary | ICD-10-CM | POA: Diagnosis not present

## 2014-01-15 DIAGNOSIS — M9981 Other biomechanical lesions of cervical region: Secondary | ICD-10-CM | POA: Diagnosis not present

## 2014-01-29 DIAGNOSIS — M9903 Segmental and somatic dysfunction of lumbar region: Secondary | ICD-10-CM | POA: Diagnosis not present

## 2014-01-29 DIAGNOSIS — M545 Low back pain: Secondary | ICD-10-CM | POA: Diagnosis not present

## 2014-01-29 DIAGNOSIS — M5032 Other cervical disc degeneration, mid-cervical region: Secondary | ICD-10-CM | POA: Diagnosis not present

## 2014-01-29 DIAGNOSIS — M9901 Segmental and somatic dysfunction of cervical region: Secondary | ICD-10-CM | POA: Diagnosis not present

## 2014-02-04 DIAGNOSIS — J387 Other diseases of larynx: Secondary | ICD-10-CM | POA: Diagnosis not present

## 2014-02-04 DIAGNOSIS — J300 Vasomotor rhinitis: Secondary | ICD-10-CM | POA: Diagnosis not present

## 2014-02-04 DIAGNOSIS — H9190 Unspecified hearing loss, unspecified ear: Secondary | ICD-10-CM | POA: Diagnosis not present

## 2014-02-04 DIAGNOSIS — T161XXA Foreign body in right ear, initial encounter: Secondary | ICD-10-CM | POA: Diagnosis not present

## 2014-02-09 ENCOUNTER — Other Ambulatory Visit: Payer: Self-pay | Admitting: *Deleted

## 2014-02-09 MED ORDER — SOTALOL HCL 80 MG PO TABS: 40.0000 mg | ORAL_TABLET | Freq: Two times a day (BID) | ORAL | Status: DC

## 2014-02-24 DIAGNOSIS — Z23 Encounter for immunization: Secondary | ICD-10-CM | POA: Diagnosis not present

## 2014-03-11 DIAGNOSIS — M545 Low back pain: Secondary | ICD-10-CM | POA: Diagnosis not present

## 2014-03-11 DIAGNOSIS — M5032 Other cervical disc degeneration, mid-cervical region: Secondary | ICD-10-CM | POA: Diagnosis not present

## 2014-03-11 DIAGNOSIS — M9903 Segmental and somatic dysfunction of lumbar region: Secondary | ICD-10-CM | POA: Diagnosis not present

## 2014-03-11 DIAGNOSIS — M9901 Segmental and somatic dysfunction of cervical region: Secondary | ICD-10-CM | POA: Diagnosis not present

## 2014-03-27 DIAGNOSIS — M5032 Other cervical disc degeneration, mid-cervical region: Secondary | ICD-10-CM | POA: Diagnosis not present

## 2014-03-27 DIAGNOSIS — M9901 Segmental and somatic dysfunction of cervical region: Secondary | ICD-10-CM | POA: Diagnosis not present

## 2014-03-27 DIAGNOSIS — M545 Low back pain: Secondary | ICD-10-CM | POA: Diagnosis not present

## 2014-03-27 DIAGNOSIS — M9903 Segmental and somatic dysfunction of lumbar region: Secondary | ICD-10-CM | POA: Diagnosis not present

## 2014-05-08 DIAGNOSIS — H2513 Age-related nuclear cataract, bilateral: Secondary | ICD-10-CM | POA: Diagnosis not present

## 2014-05-08 DIAGNOSIS — H04123 Dry eye syndrome of bilateral lacrimal glands: Secondary | ICD-10-CM | POA: Diagnosis not present

## 2014-06-17 DIAGNOSIS — M545 Low back pain: Secondary | ICD-10-CM | POA: Diagnosis not present

## 2014-06-17 DIAGNOSIS — M9903 Segmental and somatic dysfunction of lumbar region: Secondary | ICD-10-CM | POA: Diagnosis not present

## 2014-06-17 DIAGNOSIS — M9901 Segmental and somatic dysfunction of cervical region: Secondary | ICD-10-CM | POA: Diagnosis not present

## 2014-06-17 DIAGNOSIS — M5032 Other cervical disc degeneration, mid-cervical region: Secondary | ICD-10-CM | POA: Diagnosis not present

## 2014-06-21 DIAGNOSIS — M5032 Other cervical disc degeneration, mid-cervical region: Secondary | ICD-10-CM | POA: Diagnosis not present

## 2014-06-21 DIAGNOSIS — M9903 Segmental and somatic dysfunction of lumbar region: Secondary | ICD-10-CM | POA: Diagnosis not present

## 2014-06-21 DIAGNOSIS — M9901 Segmental and somatic dysfunction of cervical region: Secondary | ICD-10-CM | POA: Diagnosis not present

## 2014-06-21 DIAGNOSIS — M545 Low back pain: Secondary | ICD-10-CM | POA: Diagnosis not present

## 2014-07-04 DIAGNOSIS — M79645 Pain in left finger(s): Secondary | ICD-10-CM | POA: Diagnosis not present

## 2014-07-26 DIAGNOSIS — M25511 Pain in right shoulder: Secondary | ICD-10-CM | POA: Diagnosis not present

## 2014-07-26 DIAGNOSIS — M542 Cervicalgia: Secondary | ICD-10-CM | POA: Diagnosis not present

## 2014-07-30 DIAGNOSIS — M25511 Pain in right shoulder: Secondary | ICD-10-CM | POA: Diagnosis not present

## 2014-07-30 DIAGNOSIS — M542 Cervicalgia: Secondary | ICD-10-CM | POA: Diagnosis not present

## 2014-08-01 DIAGNOSIS — M25511 Pain in right shoulder: Secondary | ICD-10-CM | POA: Diagnosis not present

## 2014-08-01 DIAGNOSIS — M542 Cervicalgia: Secondary | ICD-10-CM | POA: Diagnosis not present

## 2014-08-06 DIAGNOSIS — M25511 Pain in right shoulder: Secondary | ICD-10-CM | POA: Diagnosis not present

## 2014-08-06 DIAGNOSIS — M542 Cervicalgia: Secondary | ICD-10-CM | POA: Diagnosis not present

## 2014-08-09 DIAGNOSIS — M542 Cervicalgia: Secondary | ICD-10-CM | POA: Diagnosis not present

## 2014-08-09 DIAGNOSIS — M25511 Pain in right shoulder: Secondary | ICD-10-CM | POA: Diagnosis not present

## 2014-08-15 DIAGNOSIS — M25511 Pain in right shoulder: Secondary | ICD-10-CM | POA: Diagnosis not present

## 2014-08-15 DIAGNOSIS — M542 Cervicalgia: Secondary | ICD-10-CM | POA: Diagnosis not present

## 2014-08-21 DIAGNOSIS — M542 Cervicalgia: Secondary | ICD-10-CM | POA: Diagnosis not present

## 2014-08-21 DIAGNOSIS — M25511 Pain in right shoulder: Secondary | ICD-10-CM | POA: Diagnosis not present

## 2014-08-30 DIAGNOSIS — L820 Inflamed seborrheic keratosis: Secondary | ICD-10-CM | POA: Diagnosis not present

## 2014-08-30 DIAGNOSIS — L578 Other skin changes due to chronic exposure to nonionizing radiation: Secondary | ICD-10-CM | POA: Diagnosis not present

## 2014-08-30 DIAGNOSIS — L235 Allergic contact dermatitis due to other chemical products: Secondary | ICD-10-CM | POA: Diagnosis not present

## 2014-08-30 DIAGNOSIS — L814 Other melanin hyperpigmentation: Secondary | ICD-10-CM | POA: Diagnosis not present

## 2014-09-27 DIAGNOSIS — R42 Dizziness and giddiness: Secondary | ICD-10-CM | POA: Diagnosis not present

## 2014-09-27 DIAGNOSIS — I4891 Unspecified atrial fibrillation: Secondary | ICD-10-CM | POA: Diagnosis not present

## 2014-09-27 DIAGNOSIS — J309 Allergic rhinitis, unspecified: Secondary | ICD-10-CM | POA: Diagnosis not present

## 2014-09-30 DIAGNOSIS — H5010 Unspecified exotropia: Secondary | ICD-10-CM | POA: Diagnosis not present

## 2014-10-04 ENCOUNTER — Encounter: Payer: Self-pay | Admitting: Cardiology

## 2014-10-07 DIAGNOSIS — H532 Diplopia: Secondary | ICD-10-CM | POA: Diagnosis not present

## 2014-12-02 ENCOUNTER — Other Ambulatory Visit: Payer: Self-pay

## 2014-12-02 MED ORDER — SOTALOL HCL 80 MG PO TABS: 40.0000 mg | ORAL_TABLET | Freq: Two times a day (BID) | ORAL | Status: DC

## 2014-12-03 DIAGNOSIS — M9901 Segmental and somatic dysfunction of cervical region: Secondary | ICD-10-CM | POA: Diagnosis not present

## 2014-12-03 DIAGNOSIS — M9903 Segmental and somatic dysfunction of lumbar region: Secondary | ICD-10-CM | POA: Diagnosis not present

## 2014-12-03 DIAGNOSIS — M545 Low back pain: Secondary | ICD-10-CM | POA: Diagnosis not present

## 2014-12-03 DIAGNOSIS — M5032 Other cervical disc degeneration, mid-cervical region: Secondary | ICD-10-CM | POA: Diagnosis not present

## 2014-12-18 DIAGNOSIS — I480 Paroxysmal atrial fibrillation: Secondary | ICD-10-CM | POA: Diagnosis not present

## 2014-12-18 DIAGNOSIS — J309 Allergic rhinitis, unspecified: Secondary | ICD-10-CM | POA: Diagnosis not present

## 2014-12-18 DIAGNOSIS — R946 Abnormal results of thyroid function studies: Secondary | ICD-10-CM | POA: Diagnosis not present

## 2014-12-18 DIAGNOSIS — Z0000 Encounter for general adult medical examination without abnormal findings: Secondary | ICD-10-CM | POA: Diagnosis not present

## 2014-12-18 DIAGNOSIS — Z1389 Encounter for screening for other disorder: Secondary | ICD-10-CM | POA: Diagnosis not present

## 2014-12-20 DIAGNOSIS — M9903 Segmental and somatic dysfunction of lumbar region: Secondary | ICD-10-CM | POA: Diagnosis not present

## 2014-12-20 DIAGNOSIS — M545 Low back pain: Secondary | ICD-10-CM | POA: Diagnosis not present

## 2014-12-20 DIAGNOSIS — M5032 Other cervical disc degeneration, mid-cervical region: Secondary | ICD-10-CM | POA: Diagnosis not present

## 2014-12-20 DIAGNOSIS — M9901 Segmental and somatic dysfunction of cervical region: Secondary | ICD-10-CM | POA: Diagnosis not present

## 2015-01-06 ENCOUNTER — Ambulatory Visit: Payer: Medicare Other | Admitting: Cardiology

## 2015-01-19 DIAGNOSIS — H6982 Other specified disorders of Eustachian tube, left ear: Secondary | ICD-10-CM | POA: Diagnosis not present

## 2015-01-19 DIAGNOSIS — J3089 Other allergic rhinitis: Secondary | ICD-10-CM | POA: Diagnosis not present

## 2015-01-20 ENCOUNTER — Ambulatory Visit: Payer: Medicare Other | Admitting: Cardiology

## 2015-01-21 DIAGNOSIS — E860 Dehydration: Secondary | ICD-10-CM | POA: Diagnosis not present

## 2015-03-04 DIAGNOSIS — J387 Other diseases of larynx: Secondary | ICD-10-CM | POA: Diagnosis not present

## 2015-03-04 DIAGNOSIS — M26602 Left temporomandibular joint disorder, unspecified: Secondary | ICD-10-CM | POA: Diagnosis not present

## 2015-03-04 DIAGNOSIS — J300 Vasomotor rhinitis: Secondary | ICD-10-CM | POA: Diagnosis not present

## 2015-03-04 DIAGNOSIS — H9202 Otalgia, left ear: Secondary | ICD-10-CM | POA: Diagnosis not present

## 2015-03-13 ENCOUNTER — Telehealth: Payer: Self-pay

## 2015-03-13 NOTE — Telephone Encounter (Signed)
I CALLED PATIENT TO LET HIM KNOW THAT WE DID NOT HAVE THE ANGIOGRAM FROM [place]

## 2015-03-14 ENCOUNTER — Encounter: Payer: Self-pay | Admitting: Cardiology

## 2015-03-14 ENCOUNTER — Ambulatory Visit (INDEPENDENT_AMBULATORY_CARE_PROVIDER_SITE_OTHER): Payer: Medicare Other | Admitting: Cardiology

## 2015-03-14 VITALS — BP 136/86 | HR 50 | Ht 69.0 in | Wt 176.2 lb

## 2015-03-14 DIAGNOSIS — I4891 Unspecified atrial fibrillation: Secondary | ICD-10-CM | POA: Diagnosis not present

## 2015-03-14 DIAGNOSIS — E785 Hyperlipidemia, unspecified: Secondary | ICD-10-CM

## 2015-03-14 DIAGNOSIS — I2583 Coronary atherosclerosis due to lipid rich plaque: Secondary | ICD-10-CM

## 2015-03-14 DIAGNOSIS — I2510 Atherosclerotic heart disease of native coronary artery without angina pectoris: Secondary | ICD-10-CM | POA: Diagnosis not present

## 2015-03-14 DIAGNOSIS — I480 Paroxysmal atrial fibrillation: Secondary | ICD-10-CM

## 2015-03-14 LAB — BASIC METABOLIC PANEL
BUN: 17 mg/dL (ref 7–25)
CHLORIDE: 102 mmol/L (ref 98–110)
CO2: 23 mmol/L (ref 20–31)
Calcium: 9.1 mg/dL (ref 8.6–10.3)
Creat: 1.18 mg/dL — ABNORMAL HIGH (ref 0.70–1.11)
Glucose, Bld: 81 mg/dL (ref 65–99)
POTASSIUM: 4.7 mmol/L (ref 3.5–5.3)
SODIUM: 134 mmol/L — AB (ref 135–146)

## 2015-03-14 LAB — MAGNESIUM: MAGNESIUM: 2.1 mg/dL (ref 1.5–2.5)

## 2015-03-14 NOTE — Progress Notes (Signed)
Cardiology Office Note   Date:  03/14/2015   ID:  George Cain, DOB [birth year], MRN [id number]  PCP:  Vena Austria, MD    Chief Complaint  Patient presents with  . Follow-up    CAD, PAF      History of Present Illness: George Cain is a 81 y.o. male with a history of PAF s/p RFA 07/2012 and nonobstructive ASCAD with 20% LAD by cathand dyslipidemia who presents today for followup. He is doing well. He denies any chest pain, SOB, DOE, LE edema, dizziness or syncope.  Over the past year he has had 3 episodes of skipped heart beats lasting a few hours.  He says it did not feel like his atrial fibrillation.      Past Medical History  Diagnosis Date  . CHF (congestive heart failure) ([place])   . Allergic rhinitis   . BPH (benign prostatic hypertrophy)   . PAF (paroxysmal atrial fibrillation) ([place])   . Hyperlipidemia   . Coronary artery disease 2011    nonobstructive ASCAD with 20% LAD s    Past Surgical History  Procedure Laterality Date  . Colonoscopy  06/15/2010  . Radiofrequency ablation  08/23/12  . Cardiac catheterization  2011    Nonobstructive ASCAD w 20% LAD  . Tonsillectomy  1940    as child     Current Outpatient Prescriptions  Medication Sig Dispense Refill  . aspirin EC 81 MG tablet Take 1 tablet (81 mg total) by mouth daily.    . carboxymethylcellulose 1 % ophthalmic solution Apply 1 drop to eye every morning.    . cetirizine (ZYRTEC) 10 MG tablet Take 10 mg by mouth daily.    . cyclobenzaprine (FLEXERIL) 10 MG tablet Take 10 mg by mouth 3 (three) times daily as needed.     . diphenhydramine-acetaminophen (TYLENOL PM) 25-500 MG TABS Take 2 tablets by mouth 3 (three) times daily.     Marland Kitchen docusate sodium (COLACE) 100 MG capsule Take 100 mg by mouth daily.     . finasteride (PROSCAR) 5 MG tablet Take 5 mg by mouth daily.    . flunisolide (NASALIDE) 25 MCG/ACT (0.025%) SOLN Place 2 sprays into the nose 2 (two) times daily.    .  fluticasone (FLONASE) 50 MCG/ACT nasal spray Place 2 sprays into both nostrils daily.    . magnesium oxide (MAG-OX) 400 MG tablet Take 400 mg by mouth 2 (two) times daily.    . Multiple Vitamin (MULTIVITAMIN WITH MINERALS) TABS tablet Take 1 tablet by mouth daily.    . potassium gluconate 595 MG TABS tablet Take 75 mg by mouth daily.     . sotalol (BETAPACE) 80 MG tablet Take 0.5 tablets (40 mg total) by mouth 2 (two) times daily. 30 tablet 11  . B Complex-C (SUPER B COMPLEX/VITAMIN C) TABS Take 1 tablet by mouth daily.     No current facility-administered medications for this visit.    Allergies:   Keflex    Social History:  The patient  reports that he has never smoked. He does not have any smokeless tobacco history on file. He reports that he does not drink alcohol or use illicit drugs.   Family History:  The patient's family history includes CAD in his father; Diabetes in his father; Heart attack in his father; Hypertension in his mother; Prostate cancer in his father.    ROS:  Please see the  history of present illness.   Otherwise, review of systems are positive for none.   All other systems are reviewed and negative.    PHYSICAL EXAM: VS:  BP 136/86 mmHg  Pulse 50  Ht 5\' 9"  (1.753 m)  Wt 79.946 kg (176 lb 4 oz)  BMI 26.02 kg/m2 , BMI Body mass index is 26.02 kg/(m^2). GEN: Well nourished, well developed, in no acute distress HEENT: normal Neck: no JVD, carotid bruits, or masses Cardiac: RRR; no murmurs, rubs, or gallops,no edema  Respiratory:  clear to auscultation bilaterally, normal work of breathing GI: soft, nontender, nondistended, + BS MS: no deformity or atrophy Skin: warm and dry, no rash Neuro:  Strength and sensation are intact Psych: euthymic mood, full affect   EKG:  EKG was ordered today. The ekg ordered today demonstrates sinus bradycardia at 50bpm with early repolarization   Recent Labs: No results found for requested labs within last 365 days.     Lipid Panel No results found for: CHOL, TRIG, HDL, CHOLHDL, VLDL, LDLCALC, LDLDIRECT    Wt Readings from Last 3 Encounters:  03/14/15 79.946 kg (176 lb 4 oz)  01/03/14 79.289 kg (174 lb 12.8 oz)    ASSESSMENT AND PLAN:  1. PAF s/p RF ablation maintaining NSR on Sotolol. QTC ok on EKG at 382 msec.  Check BMET 2. Nonobstructive ASCAD with no angina  - continue ASA 3.  Dyslipidemia - last LDL was 106 and HDL 71. He currently is not on any statin.I will get a lipid panel from his PCP. 4.  Palpitaitons - I will get a 30 day heart monitor to assess for arrhythmias   Current medicines are reviewed at length with the patient today.  The patient does not have concerns regarding medicines.  The following changes have been made:  no change  Labs/ tests ordered today: See above Assessment and Plan No orders of the defined types were placed in this encounter.     Disposition:   FU with me in 1 year  Signed, Sueanne Margarita, MD  03/14/2015 10:39 AM    [facility] [address], [place], [place]  [address] Phone: [phone]; Fax: [phone]

## 2015-03-14 NOTE — Patient Instructions (Signed)
Medication Instructions:  Your physician recommends that you continue on your current medications as directed. Please refer to the Current Medication list given to you today.   Labwork: TODAY: BMET, Magnesium  Testing/Procedures: Your physician has recommended that you wear an event monitor. Event monitors are medical devices that record the heart's electrical activity. Doctors most often [place] these monitors to diagnose arrhythmias. Arrhythmias are problems with the speed or rhythm of the heartbeat. The monitor is a small, portable device. You can wear one while you do your normal daily activities. This is usually used to diagnose what is causing palpitations/syncope (passing out).  Follow-Up: Your physician wants you to follow-up in: 1 year with Dr. Radford Pax. You will receive a reminder letter in the mail two months in advance. If you don't receive a letter, please call our office to schedule the follow-up appointment.   Any Other Special Instructions Will Be Listed Below (If Applicable).     If you need a refill on your cardiac medications before your next appointment, please call your pharmacy.

## 2015-03-18 ENCOUNTER — Encounter: Payer: Self-pay | Admitting: Cardiology

## 2015-03-18 DIAGNOSIS — M9903 Segmental and somatic dysfunction of lumbar region: Secondary | ICD-10-CM | POA: Diagnosis not present

## 2015-03-18 DIAGNOSIS — M9901 Segmental and somatic dysfunction of cervical region: Secondary | ICD-10-CM | POA: Diagnosis not present

## 2015-03-18 DIAGNOSIS — M545 Low back pain: Secondary | ICD-10-CM | POA: Diagnosis not present

## 2015-03-19 ENCOUNTER — Ambulatory Visit (INDEPENDENT_AMBULATORY_CARE_PROVIDER_SITE_OTHER): Payer: Medicare Other

## 2015-03-19 ENCOUNTER — Telehealth: Payer: Self-pay

## 2015-03-19 DIAGNOSIS — I480 Paroxysmal atrial fibrillation: Secondary | ICD-10-CM | POA: Diagnosis not present

## 2015-03-19 DIAGNOSIS — E785 Hyperlipidemia, unspecified: Secondary | ICD-10-CM

## 2015-03-19 NOTE — Telephone Encounter (Signed)
Scheduled patient for fasting lab work next Thursday, December 1. Patient agrees with treatment plan.

## 2015-03-19 NOTE — Telephone Encounter (Signed)
-----   Message from Sueanne Margarita, MD sent at 03/19/2015 10:37 AM EST ----- PCP has not checked FLP in 1 year.  Please have patient come in for FLP and ALT

## 2015-03-25 DIAGNOSIS — M9901 Segmental and somatic dysfunction of cervical region: Secondary | ICD-10-CM | POA: Diagnosis not present

## 2015-03-25 DIAGNOSIS — M545 Low back pain: Secondary | ICD-10-CM | POA: Diagnosis not present

## 2015-03-25 DIAGNOSIS — M9903 Segmental and somatic dysfunction of lumbar region: Secondary | ICD-10-CM | POA: Diagnosis not present

## 2015-03-27 ENCOUNTER — Other Ambulatory Visit (INDEPENDENT_AMBULATORY_CARE_PROVIDER_SITE_OTHER): Payer: Medicare Other

## 2015-03-27 DIAGNOSIS — E785 Hyperlipidemia, unspecified: Secondary | ICD-10-CM

## 2015-03-28 DIAGNOSIS — L814 Other melanin hyperpigmentation: Secondary | ICD-10-CM | POA: Diagnosis not present

## 2015-03-28 DIAGNOSIS — L570 Actinic keratosis: Secondary | ICD-10-CM | POA: Diagnosis not present

## 2015-03-28 DIAGNOSIS — L578 Other skin changes due to chronic exposure to nonionizing radiation: Secondary | ICD-10-CM | POA: Diagnosis not present

## 2015-04-03 ENCOUNTER — Other Ambulatory Visit (INDEPENDENT_AMBULATORY_CARE_PROVIDER_SITE_OTHER): Payer: Medicare Other

## 2015-04-03 DIAGNOSIS — I2510 Atherosclerotic heart disease of native coronary artery without angina pectoris: Secondary | ICD-10-CM | POA: Diagnosis not present

## 2015-04-03 DIAGNOSIS — I2583 Coronary atherosclerosis due to lipid rich plaque: Secondary | ICD-10-CM | POA: Diagnosis not present

## 2015-04-03 LAB — LIPID PANEL
CHOLESTEROL: 144 mg/dL (ref 125–200)
HDL: 51 mg/dL (ref >=40)
LDL CALC: 76 mg/dL (ref <130)
TRIGLYCERIDES: 86 mg/dL (ref <150)
Total CHOL/HDL Ratio: 2.8 Ratio (ref <=5.0)
VLDL: 17 mg/dL (ref <30)

## 2015-04-03 LAB — ALT: ALT: 28 U/L (ref 9–46)

## 2015-04-04 ENCOUNTER — Encounter: Payer: Self-pay | Admitting: Cardiology

## 2015-04-04 ENCOUNTER — Telehealth: Payer: Self-pay | Admitting: Cardiology

## 2015-04-04 NOTE — Telephone Encounter (Signed)
-----   Message from Sueanne Margarita, MD sent at 04/03/2015 10:36 PM EST ----- LDL not at goal - add Lipitor 5mg  daily and rechek FLP and ALT in 6 weeks

## 2015-04-04 NOTE — Telephone Encounter (Signed)
Informed patient of results and verbal understanding expressed.  Patient refuses to start new medication at this time. Instructed patient to watch fats, increase fiber and exercise and that labs will be repeated in a few months. Patient agrees with treatment plan.

## 2015-04-04 NOTE — Telephone Encounter (Signed)
New message ° ° ° ° °Returning a call to the nurse °

## 2015-04-14 ENCOUNTER — Telehealth: Payer: Self-pay | Admitting: Cardiology

## 2015-04-14 NOTE — Telephone Encounter (Signed)
New message      Calling to let the nurse know that he has not received a copy of his last lab report.  He had asked the nurse to send him a copy

## 2015-04-14 NOTE — Telephone Encounter (Signed)
Informed patient that labs were mailed, but will be mailed again just in case. Reiterated to patient that mail takes about a week since it is taken to our other office, weighted and stamped before going out. Patient grateful for call.

## 2015-04-24 ENCOUNTER — Telehealth: Payer: Self-pay

## 2015-04-24 ENCOUNTER — Other Ambulatory Visit (INDEPENDENT_AMBULATORY_CARE_PROVIDER_SITE_OTHER): Payer: Medicare Other | Admitting: *Deleted

## 2015-04-24 DIAGNOSIS — I480 Paroxysmal atrial fibrillation: Secondary | ICD-10-CM | POA: Diagnosis not present

## 2015-04-24 DIAGNOSIS — I4891 Unspecified atrial fibrillation: Secondary | ICD-10-CM | POA: Diagnosis not present

## 2015-04-24 LAB — BASIC METABOLIC PANEL
BUN: 27 mg/dL — AB (ref 7–25)
CALCIUM: 8.9 mg/dL (ref 8.6–10.3)
CO2: 22 mmol/L (ref 20–31)
Chloride: 103 mmol/L (ref 98–110)
Creat: 1.19 mg/dL — ABNORMAL HIGH (ref 0.70–1.11)
Glucose, Bld: 97 mg/dL (ref 65–99)
POTASSIUM: 4.6 mmol/L (ref 3.5–5.3)
SODIUM: 135 mmol/L (ref 135–146)

## 2015-04-24 LAB — CBC WITH DIFFERENTIAL/PLATELET
BASOS ABS: 0 10*3/uL (ref 0.0–0.1)
BASOS PCT: 0 % (ref 0–1)
EOS PCT: 1 % (ref 0–5)
Eosinophils Absolute: 0 10*3/uL (ref 0.0–0.7)
HEMATOCRIT: 34.6 % — AB (ref 39.0–52.0)
Hemoglobin: 11.7 g/dL — ABNORMAL LOW (ref 13.0–17.0)
LYMPHS PCT: 26 % (ref 12–46)
Lymphs Abs: 1.2 10*3/uL (ref 0.7–4.0)
MCH: 30.5 pg (ref 26.0–34.0)
MCHC: 33.8 g/dL (ref 30.0–36.0)
MCV: 90.1 fL (ref 78.0–100.0)
MONO ABS: 0.6 10*3/uL (ref 0.1–1.0)
MPV: 8.5 fL — AB (ref 8.6–12.4)
Monocytes Relative: 14 % — ABNORMAL HIGH (ref 3–12)
NEUTROS ABS: 2.7 10*3/uL (ref 1.7–7.7)
Neutrophils Relative %: 59 % (ref 43–77)
Platelets: 199 10*3/uL (ref 150–400)
RBC: 3.84 MIL/uL — AB (ref 4.22–5.81)
RDW: 14.1 % (ref 11.5–15.5)
WBC: 4.5 10*3/uL (ref 4.0–10.5)

## 2015-04-24 NOTE — Telephone Encounter (Signed)
-----   Message from Sueanne Margarita, MD sent at 04/17/2015  3:40 PM EST ----- Patient had paroxysmal atrial flutter on heart monitor and CHADS2VASC score is 4 (age > 28, CAD, CHF).  Start Eliquis 5mg  BID.  Needs to come in for NOAC panel.  Stop ASA.  Refer to Dr. Rayann Heman - patient has history of afib in the past s/l ablation and is now on Sotolol but cannot increase further due to bradycardia.  Please confirm with patient that he has not had any bleeding problems in the past.

## 2015-04-24 NOTE — Telephone Encounter (Signed)
Patient scheduled 1/3 with Dr. Caryl Comes.

## 2015-04-24 NOTE — Telephone Encounter (Signed)
Informed patient of results and verbal understanding expressed.  Confirmed with patient he has had no bleeding problems in the past. Instructed patient to START ELIQUIS 5 mg BID and STOP ASA. Risks and benefits of starting NOAC discussed. 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 Patient to come into office for BMET and CBC today. Patient agrees with treatment plan and awaits call from scheduler.

## 2015-04-29 ENCOUNTER — Encounter: Payer: Self-pay | Admitting: Internal Medicine

## 2015-04-29 ENCOUNTER — Ambulatory Visit (INDEPENDENT_AMBULATORY_CARE_PROVIDER_SITE_OTHER): Payer: Medicare Other | Admitting: Internal Medicine

## 2015-04-29 VITALS — BP 150/82 | HR 52 | Ht 68.0 in | Wt 175.6 lb

## 2015-04-29 DIAGNOSIS — I480 Paroxysmal atrial fibrillation: Secondary | ICD-10-CM

## 2015-04-29 MED ORDER — APIXABAN 5 MG PO TABS
5.0000 mg | ORAL_TABLET | Freq: Two times a day (BID) | ORAL | Status: DC
Start: 2015-04-29 — End: 2015-09-24

## 2015-04-29 MED ORDER — APIXABAN 5 MG PO TABS: 5.0000 mg | ORAL_TABLET | Freq: Two times a day (BID) | ORAL | Status: DC

## 2015-04-29 NOTE — Patient Instructions (Addendum)
Medication Instructions: 1) Start Eliquis 5 mg one tablet by mouth twice daily 2) Stop Aspirin  Labwork: - none  Procedures/Testing: - none  Follow-Up: - Dr. Caryl Comes will see you back on an as needed basis.  Any Additional Special Instructions Will Be Listed Below (If Applicable). - none

## 2015-04-29 NOTE — Progress Notes (Signed)
ELECTROPHYSIOLOGY CONSULT NOTE  Patient ID: George Cain, MRN: [id number], DOB/AGE: 1929-07-13 82 y.o. Admit date: (Not on file) Date of Consult: 04/29/2015  Primary Physician: Vena Austria, MD Primary Cardiologist: TT  Chief Complaint:AFib  HPI George Cain is a 82 y.o. male   Is seen because of recurrent atrial fibrillation.  He was initially diagnosed about 10 years ago. These episodes were notable for palpitations but not otherwise significantly problematic. There is minimal lightheadedness or shortness of breath. He was treated with sotalol with good control and infrequent recurrences.  There have been some discussions over the years about anticoagulation. He has been reluctant to pursue it. He has been on aspirin.  Over the last year he has had 3 or 4 episodes. None of them has lasted more than-6-12 hours  He mentioned it to Dr. Radford Pax who undertook an event recorder noting short episodes of atrial fibrillation. Rates were not excessively fast.  He does not have a history of bleeding problems although he does bruise some with his aspirin    Past Medical History  Diagnosis Date  . CHF (congestive heart failure) ([place])   . Allergic rhinitis   . BPH (benign prostatic hypertrophy)   . PAF (paroxysmal atrial fibrillation) ([place])   . Hyperlipidemia   . Coronary artery disease 2011    nonobstructive ASCAD with 20% LAD s      Surgical History:  Past Surgical History  Procedure Laterality Date  . Colonoscopy  06/15/2010  . Radiofrequency ablation  08/23/12  . Cardiac catheterization  2011    Nonobstructive ASCAD w 20% LAD  . Tonsillectomy  1940    as child     Home Meds: Prior to Admission medications   Medication Sig Start Date End Date Taking? Authorizing Provider  aspirin EC 81 MG tablet Take 1 tablet (81 mg total) by mouth daily. 01/03/14  Yes Sueanne Margarita, MD  B Complex-C (SUPER B COMPLEX/VITAMIN C) TABS Take 1 tablet by mouth daily.   Yes  Historical Provider, MD  carboxymethylcellulose 1 % ophthalmic solution Apply 1 drop to eye 2 (two) times daily.    Yes Historical Provider, MD  cetirizine (ZYRTEC) 10 MG tablet Take 10 mg by mouth daily.   Yes Historical Provider, MD  cyclobenzaprine (FLEXERIL) 10 MG tablet Take 10 mg by mouth 3 (three) times daily as needed for muscle spasms.    Yes Historical Provider, MD  diphenhydramine-acetaminophen (TYLENOL PM) 25-500 MG TABS Take 2 tablets by mouth 3 (three) times daily.    Yes Historical Provider, MD  docusate sodium (COLACE) 100 MG capsule Take 100 mg by mouth daily.    Yes Historical Provider, MD  finasteride (PROSCAR) 5 MG tablet Take 5 mg by mouth daily.   Yes Historical Provider, MD  flunisolide (NASALIDE) 25 MCG/ACT (0.025%) SOLN Place 2 sprays into the nose 2 (two) times daily.   Yes Historical Provider, MD  fluticasone (FLONASE) 50 MCG/ACT nasal spray Place 2 sprays into both nostrils daily.   Yes Historical Provider, MD  magnesium oxide (MAG-OX) 400 MG tablet Take 400 mg by mouth 2 (two) times daily.   Yes Historical Provider, MD  Multiple Vitamin (MULTIVITAMIN WITH MINERALS) TABS tablet Take 1 tablet by mouth daily.   Yes Historical Provider, MD  potassium gluconate 595 MG TABS tablet Take 595 mg by mouth daily.    Yes Historical Provider, MD  sotalol (BETAPACE) 80 MG tablet Take 0.5 tablets (40 mg total) by mouth 2 (two) times  daily. 12/02/14  Yes Sueanne Margarita, MD    Allergies:  Allergies  Allergen Reactions  . Keflex [Cephalexin] Diarrhea, Nausea Only and Other (See Comments)    Night sweats also    Social History   Social History  . Marital Status: Married    Spouse Name: N/A  . Number of Children: N/A  . Years of Education: N/A   Occupational History  . Not on file.   Social History Main Topics  . Smoking status: Never Smoker   . Smokeless tobacco: Never Used  . Alcohol Use: No  . Drug Use: No  . Sexual Activity: Not on file   Other Topics Concern  .  Not on file   Social History Narrative     Family History  Problem Relation Age of Onset  . Hypertension Mother   . Heart attack Father   . CAD Father   . Diabetes Father   . Prostate cancer Father      ROS:  Please see the history of present illness.     All other systems reviewed and negative.    Physical Exam:   Blood pressure 150/82, pulse 52, height 5\' 8"  (1.727 m), weight 175 lb 9.6 oz (79.652 kg). General: Well developed, well nourished male in no acute distress. Head: Normocephalic, atraumatic, sclera non-icteric, no xanthomas, nares are without discharge. EENT: normal  Lymph Nodes:  none Neck: Negative for carotid bruits. JVD not elevated. Back:without scoliosis kyphosis  Lungs: Clear bilaterally to auscultation without wheezes, rales, or rhonchi. Breathing is unlabored. Heart:  Slow RRR with S1 S2. No   murmur . No rubs, or gallops appreciated. Abdomen: Soft, non-tender, non-distended with normoactive bowel sounds. No hepatomegaly. No rebound/guarding. No obvious abdominal masses. Msk:  Strength and tone appear normal for age. Extremities: No clubbing or cyanosis. No*  edema.  Distal pedal pulses are 2+ and equal bilaterally. Skin: Warm and Dry Neuro: Alert and oriented X 3. CN III-XII intact Grossly normal sensory and motor function . Psych:  Responds to questions appropriately with a normal affect.      Labs: Cardiac Enzymes No results for input(s): CKTOTAL, CKMB, TROPONINI in the last 72 hours. CBC Lab Results  Component Value Date   WBC 4.5 04/24/2015   HGB 11.7* 04/24/2015   HCT 34.6* 04/24/2015   MCV 90.1 04/24/2015   PLT 199 04/24/2015   PROTIME: No results for input(s): LABPROT, INR in the last 72 hours. Chemistry  Recent Labs Lab 04/24/15 1337  NA 135  K 4.6  CL 103  CO2 22  BUN 27*  CREATININE 1.19*  CALCIUM 8.9  GLUCOSE 97   Lipids Lab Results  Component Value Date   CHOL 144 04/03/2015   HDL 51 04/03/2015   LDLCALC 76  04/03/2015   TRIG 86 04/03/2015   BNP No results found for: PROBNP Thyroid Function Tests: No results for input(s): TSH, T4TOTAL, T3FREE, THYROIDAB in the last 72 hours.  Invalid input(s): FREET3 Miscellaneous No results found for: DDIMER  Radiology/Studies:  No results found.  EKG:   Sinus rhythm at 53 Interval 17/09/40 Axis is 30   Assessment and Plan:    atrial fibrillation-paroxysmal   Sinus bradycardia   CHADS-VASc score 2-age 65- HTN   dyslipidemia-mild    The patient has paroxysmal atrial fibrillation which has been increasingly frequent over the last  Year but not to the point where he would like to do anything different. Hence, I think continuing his sotalol makes sense. He  is on appropriate supplementation his QT interval is appropriate. Bradycardia precludes up titration; however, in the event that he had more atrial fibrillation I would recommend that we consider dofetilide. He would also like to be considered for catheter ablation not withstanding his age. We will review this at that time with Dr. Greggory Brandy.  My biggest concern related to his atrial fibrillation is the absence of  anticoagulation.   We have reviewed the [place] data, the [place] AFib  data   The European and the American guidelines  Related to the use of aspirin.   He has agreed to stop his aspirin and begin apixaban. Appropriate dose not withstanding his age is 5 mg twice daily.   He will follow-up with Dr. Radford Pax   his blood pressure remains elevated again. I think it is time to label him as having hypertension. This would further inform the value of anticoagulation.   He asked about whether he should go on statin therapy for his LDL was 76 with an  the ratio of 2.8. I suggested to him that I probably would not.   Virl Axe

## 2015-05-13 DIAGNOSIS — M9901 Segmental and somatic dysfunction of cervical region: Secondary | ICD-10-CM | POA: Diagnosis not present

## 2015-05-13 DIAGNOSIS — M545 Low back pain: Secondary | ICD-10-CM | POA: Diagnosis not present

## 2015-05-13 DIAGNOSIS — M9903 Segmental and somatic dysfunction of lumbar region: Secondary | ICD-10-CM | POA: Diagnosis not present

## 2015-06-19 DIAGNOSIS — M545 Low back pain: Secondary | ICD-10-CM | POA: Diagnosis not present

## 2015-06-19 DIAGNOSIS — M9903 Segmental and somatic dysfunction of lumbar region: Secondary | ICD-10-CM | POA: Diagnosis not present

## 2015-06-19 DIAGNOSIS — M9901 Segmental and somatic dysfunction of cervical region: Secondary | ICD-10-CM | POA: Diagnosis not present

## 2015-07-16 DIAGNOSIS — Z8679 Personal history of other diseases of the circulatory system: Secondary | ICD-10-CM | POA: Diagnosis not present

## 2015-07-16 DIAGNOSIS — Z83518 Family history of other specified eye disorder: Secondary | ICD-10-CM | POA: Diagnosis not present

## 2015-07-16 DIAGNOSIS — H52223 Regular astigmatism, bilateral: Secondary | ICD-10-CM | POA: Diagnosis not present

## 2015-07-16 DIAGNOSIS — Z833 Family history of diabetes mellitus: Secondary | ICD-10-CM | POA: Diagnosis not present

## 2015-07-16 DIAGNOSIS — H319 Unspecified disorder of choroid: Secondary | ICD-10-CM | POA: Diagnosis not present

## 2015-07-16 DIAGNOSIS — H2513 Age-related nuclear cataract, bilateral: Secondary | ICD-10-CM | POA: Diagnosis not present

## 2015-07-16 DIAGNOSIS — H5203 Hypermetropia, bilateral: Secondary | ICD-10-CM | POA: Diagnosis not present

## 2015-07-16 DIAGNOSIS — H43811 Vitreous degeneration, right eye: Secondary | ICD-10-CM | POA: Diagnosis not present

## 2015-07-16 DIAGNOSIS — H35363 Drusen (degenerative) of macula, bilateral: Secondary | ICD-10-CM | POA: Diagnosis not present

## 2015-07-16 DIAGNOSIS — H25013 Cortical age-related cataract, bilateral: Secondary | ICD-10-CM | POA: Diagnosis not present

## 2015-07-16 DIAGNOSIS — H35373 Puckering of macula, bilateral: Secondary | ICD-10-CM | POA: Diagnosis not present

## 2015-09-02 DIAGNOSIS — M9901 Segmental and somatic dysfunction of cervical region: Secondary | ICD-10-CM | POA: Diagnosis not present

## 2015-09-02 DIAGNOSIS — M9903 Segmental and somatic dysfunction of lumbar region: Secondary | ICD-10-CM | POA: Diagnosis not present

## 2015-09-02 DIAGNOSIS — M545 Low back pain: Secondary | ICD-10-CM | POA: Diagnosis not present

## 2015-09-18 ENCOUNTER — Ambulatory Visit: Payer: Medicare Other | Admitting: Cardiology

## 2015-09-24 ENCOUNTER — Encounter: Payer: Self-pay | Admitting: Cardiology

## 2015-09-24 ENCOUNTER — Ambulatory Visit (INDEPENDENT_AMBULATORY_CARE_PROVIDER_SITE_OTHER): Payer: Medicare Other | Admitting: Cardiology

## 2015-09-24 VITALS — BP 138/76 | HR 58 | Ht 68.0 in | Wt 174.6 lb

## 2015-09-24 DIAGNOSIS — E785 Hyperlipidemia, unspecified: Secondary | ICD-10-CM

## 2015-09-24 DIAGNOSIS — I480 Paroxysmal atrial fibrillation: Secondary | ICD-10-CM | POA: Diagnosis not present

## 2015-09-24 DIAGNOSIS — I2583 Coronary atherosclerosis due to lipid rich plaque: Secondary | ICD-10-CM

## 2015-09-24 DIAGNOSIS — I2510 Atherosclerotic heart disease of native coronary artery without angina pectoris: Secondary | ICD-10-CM

## 2015-09-24 NOTE — Progress Notes (Signed)
Cardiology Office Note    Date:  09/24/2015   ID:  Najm Cartner, DOB 1930/01/22, MRN [id number]  PCP:  Vena Austria, MD  Cardiologist:  Fransico Him, MD   Chief Complaint  Patient presents with  . Follow-up  . PAF    History of Present Illness:  George Cain is a 82 y.o. male with a history of PAF s/p RFA 07/2012 and nonobstructive ASCAD with 20% LAD by cathand dyslipidemia who presents today for followup. He is doing well. He denies any chest pain, SOB, DOE, LE edema, palpitations, dizziness or syncope.    Past Medical History  Diagnosis Date  . CHF (congestive heart failure) ([place])   . Allergic rhinitis   . BPH (benign prostatic hypertrophy)   . PAF (paroxysmal atrial fibrillation) ([place])     Patient refused anticoagulation  . Hyperlipidemia     Patient refused statins  . Coronary artery disease 2011    nonobstructive ASCAD with 20% LAD s    Past Surgical History  Procedure Laterality Date  . Colonoscopy  06/15/2010  . Radiofrequency ablation  08/23/12  . Cardiac catheterization  2011    Nonobstructive ASCAD w 20% LAD  . Tonsillectomy  1940    as child    Current Medications: Outpatient Prescriptions Prior to Visit  Medication Sig Dispense Refill  . B Complex-C (SUPER B COMPLEX/VITAMIN C) TABS Take 1 tablet by mouth daily.    . carboxymethylcellulose 1 % ophthalmic solution Apply 1 drop to eye 2 (two) times daily.     . cetirizine (ZYRTEC) 10 MG tablet Take 10 mg by mouth daily.    . cyclobenzaprine (FLEXERIL) 10 MG tablet Take 10 mg by mouth 3 (three) times daily as needed for muscle spasms.     . diphenhydramine-acetaminophen (TYLENOL PM) 25-500 MG TABS Take 2 tablets by mouth 3 (three) times daily.     . finasteride (PROSCAR) 5 MG tablet Take 5 mg by mouth daily.    . flunisolide (NASALIDE) 25 MCG/ACT (0.025%) SOLN Place 2 sprays into the nose 2 (two) times daily.    . fluticasone (FLONASE) 50 MCG/ACT nasal spray Place 2 sprays into both  nostrils daily.    . magnesium oxide (MAG-OX) 400 MG tablet Take 400 mg by mouth 2 (two) times daily.    . Multiple Vitamin (MULTIVITAMIN WITH MINERALS) TABS tablet Take 1 tablet by mouth daily.    . potassium gluconate 595 MG TABS tablet Take 595 mg by mouth daily.     . sotalol (BETAPACE) 80 MG tablet Take 0.5 tablets (40 mg total) by mouth 2 (two) times daily. 30 tablet 11  . apixaban (ELIQUIS) 5 MG TABS tablet Take 1 tablet (5 mg total) by mouth 2 (two) times daily. 180 tablet 3  . docusate sodium (COLACE) 100 MG capsule Take 100 mg by mouth daily.      No facility-administered medications prior to visit.     Allergies:   Keflex   Social History   Social History  . Marital Status: Married    Spouse Name: N/A  . Number of Children: N/A  . Years of Education: N/A   Social History Main Topics  . Smoking status: Never Smoker   . Smokeless tobacco: Never Used  . Alcohol Use: No  . Drug Use: No  . Sexual Activity: Not Asked   Other Topics Concern  . None   Social History Narrative     Family History:  The patient's family history includes CAD in  his father; Diabetes in his father; Heart attack in his father; Hypertension in his mother; Prostate cancer in his father.   ROS:   Please see the history of present illness.    Review of Systems  Eyes: Positive for visual disturbance.  Musculoskeletal: Positive for muscle cramps.   All other systems reviewed and are negative.   PHYSICAL EXAM:   VS:  BP 138/76 mmHg  Pulse 58  Ht 5\' 8"  (1.727 m)  Wt 174 lb 9.6 oz (79.198 kg)  BMI 26.55 kg/m2   GEN: Well nourished, well developed, in no acute distress HEENT: normal Neck: no JVD, carotid bruits, or masses Cardiac: RRR; no murmurs, rubs, or gallops,no edema.  Intact distal pulses bilaterally.  Respiratory:  clear to auscultation bilaterally, normal work of breathing GI: soft, nontender, nondistended, + BS MS: no deformity or atrophy Skin: warm and dry, no rash Neuro:  Alert  and Oriented x 3, Strength and sensation are intact Psych: euthymic mood, full affect  Wt Readings from Last 3 Encounters:  09/24/15 174 lb 9.6 oz (79.198 kg)  04/29/15 175 lb 9.6 oz (79.652 kg)  03/14/15 176 lb 4 oz (79.946 kg)      Studies/Labs Reviewed:   EKG:  EKG is not ordered today.    Recent Labs: 03/14/2015: Magnesium 2.1 04/03/2015: ALT 28 04/24/2015: BUN 27*; Creat 1.19*; Hemoglobin 11.7*; Platelets 199; Potassium 4.6; Sodium 135   Lipid Panel    Component Value Date/Time   CHOL 144 04/03/2015 0840   TRIG 86 04/03/2015 0840   HDL 51 04/03/2015 0840   CHOLHDL 2.8 04/03/2015 0840   VLDL 17 04/03/2015 0840   LDLCALC 76 04/03/2015 0840    Additional studies/ records that were reviewed today include:  none    ASSESSMENT:    1. PAF (paroxysmal atrial fibrillation) ([place])   2. Coronary artery disease due to lipid rich plaque   3. Dyslipidemia      PLAN:  In order of problems listed above:  1. PAF maintaining NSR with no breakthrough palpitation on Betapace.  He has refused anticoagulation. 2. ASCAD - nonobstructive ASCAD with no angina.  Continue ASA 3. Dyslipidemia - LDL goal < 70.  Patient refused statin.  I will check an FLP and ALT.  His last LDL was 76.      Medication Adjustments/Labs and Tests Ordered: Current medicines are reviewed at length with the patient today.  Concerns regarding medicines are outlined above.  Medication changes, Labs and Tests ordered today are listed in the Patient Instructions below.  There are no Patient Instructions on file for this visit.   Signed, Fransico Him, MD  09/24/2015 9:46 AM    [facility] [address], [place], [place]  [address] Phone: [phone]; Fax: [phone]

## 2015-09-24 NOTE — Patient Instructions (Signed)
Medication Instructions:  Your physician recommends that you continue on your current medications as directed. Please refer to the Current Medication list given to you today.   Labwork: Your physician recommends that you return for FASTING lab work.  Testing/Procedures: None  Follow-Up: Your physician wants you to follow-up in: 1 year with Dr. Turner. You will receive a reminder letter in the mail two months in advance. If you don't receive a letter, please call our office to schedule the follow-up appointment.   Any Other Special Instructions Will Be Listed Below (If Applicable).     If you need a refill on your cardiac medications before your next appointment, please call your pharmacy.   

## 2015-09-25 ENCOUNTER — Other Ambulatory Visit (INDEPENDENT_AMBULATORY_CARE_PROVIDER_SITE_OTHER): Payer: Medicare Other | Admitting: *Deleted

## 2015-09-25 DIAGNOSIS — I2510 Atherosclerotic heart disease of native coronary artery without angina pectoris: Secondary | ICD-10-CM | POA: Diagnosis not present

## 2015-09-25 DIAGNOSIS — E785 Hyperlipidemia, unspecified: Secondary | ICD-10-CM | POA: Diagnosis not present

## 2015-09-25 DIAGNOSIS — I2583 Coronary atherosclerosis due to lipid rich plaque: Secondary | ICD-10-CM | POA: Diagnosis not present

## 2015-09-25 LAB — LIPID PANEL
CHOL/HDL RATIO: 2.6 ratio (ref <=5.0)
CHOLESTEROL: 176 mg/dL (ref 125–200)
HDL: 68 mg/dL (ref >=40)
LDL Cholesterol: 86 mg/dL (ref <130)
Triglycerides: 111 mg/dL (ref <150)
VLDL: 22 mg/dL (ref <30)

## 2015-09-25 LAB — HEPATIC FUNCTION PANEL
ALBUMIN: 4.2 g/dL (ref 3.6–5.1)
ALT: 16 U/L (ref 9–46)
AST: 24 U/L (ref 10–35)
Alkaline Phosphatase: 65 U/L (ref 40–115)
BILIRUBIN TOTAL: 0.6 mg/dL (ref 0.2–1.2)
Bilirubin, Direct: 0.1 mg/dL (ref <=0.2)
Indirect Bilirubin: 0.5 mg/dL (ref 0.2–1.2)
TOTAL PROTEIN: 6.8 g/dL (ref 6.1–8.1)

## 2015-10-02 ENCOUNTER — Telehealth: Payer: Self-pay | Admitting: Cardiology

## 2015-10-02 NOTE — Telephone Encounter (Signed)
New message      Pt received lab results from nurse a few days ago.  He forgot to ask for a copy of the 09-25-15 labs to be mailed to his home.  If any problems, please call

## 2015-10-02 NOTE — Telephone Encounter (Signed)
Labs printed and mailed to address on file

## 2015-10-16 DIAGNOSIS — M9901 Segmental and somatic dysfunction of cervical region: Secondary | ICD-10-CM | POA: Diagnosis not present

## 2015-10-16 DIAGNOSIS — M545 Low back pain: Secondary | ICD-10-CM | POA: Diagnosis not present

## 2015-10-16 DIAGNOSIS — M9903 Segmental and somatic dysfunction of lumbar region: Secondary | ICD-10-CM | POA: Diagnosis not present

## 2015-10-20 DIAGNOSIS — M545 Low back pain: Secondary | ICD-10-CM | POA: Diagnosis not present

## 2015-10-20 DIAGNOSIS — M9903 Segmental and somatic dysfunction of lumbar region: Secondary | ICD-10-CM | POA: Diagnosis not present

## 2015-10-20 DIAGNOSIS — M9901 Segmental and somatic dysfunction of cervical region: Secondary | ICD-10-CM | POA: Diagnosis not present

## 2015-11-06 DIAGNOSIS — H2513 Age-related nuclear cataract, bilateral: Secondary | ICD-10-CM | POA: Diagnosis not present

## 2015-11-06 DIAGNOSIS — H25013 Cortical age-related cataract, bilateral: Secondary | ICD-10-CM | POA: Diagnosis not present

## 2015-11-06 DIAGNOSIS — Z83518 Family history of other specified eye disorder: Secondary | ICD-10-CM | POA: Diagnosis not present

## 2015-11-06 DIAGNOSIS — H35363 Drusen (degenerative) of macula, bilateral: Secondary | ICD-10-CM | POA: Diagnosis not present

## 2015-11-06 DIAGNOSIS — H02402 Unspecified ptosis of left eyelid: Secondary | ICD-10-CM | POA: Diagnosis not present

## 2015-11-08 ENCOUNTER — Encounter (HOSPITAL_BASED_OUTPATIENT_CLINIC_OR_DEPARTMENT_OTHER): Payer: Self-pay | Admitting: *Deleted

## 2015-11-08 ENCOUNTER — Emergency Department (HOSPITAL_BASED_OUTPATIENT_CLINIC_OR_DEPARTMENT_OTHER)
Admission: EM | Admit: 2015-11-08 | Discharge: 2015-11-08 | Disposition: A | Discharge status: Home / Self Care | Payer: Medicare Other | Attending: Emergency Medicine | Admitting: Emergency Medicine

## 2015-11-08 DIAGNOSIS — H109 Unspecified conjunctivitis: Secondary | ICD-10-CM | POA: Diagnosis not present

## 2015-11-08 DIAGNOSIS — E785 Hyperlipidemia, unspecified: Secondary | ICD-10-CM | POA: Insufficient documentation

## 2015-11-08 DIAGNOSIS — I2510 Atherosclerotic heart disease of native coronary artery without angina pectoris: Secondary | ICD-10-CM | POA: Diagnosis not present

## 2015-11-08 DIAGNOSIS — I509 Heart failure, unspecified: Secondary | ICD-10-CM | POA: Insufficient documentation

## 2015-11-08 DIAGNOSIS — I480 Paroxysmal atrial fibrillation: Secondary | ICD-10-CM | POA: Insufficient documentation

## 2015-11-08 DIAGNOSIS — H579 Unspecified disorder of eye and adnexa: Secondary | ICD-10-CM | POA: Diagnosis present

## 2015-11-08 MED ORDER — FLUORESCEIN SODIUM 1 MG OP STRP
1.0000 | ORAL_STRIP | Freq: Once | OPHTHALMIC | Status: AC
  Administered 2015-11-08: 10:00:00 via OPHTHALMIC
  Filled 2015-11-08: qty 1

## 2015-11-08 MED ORDER — POLYMYXIN B-TRIMETHOPRIM 10000-0.1 UNIT/ML-% OP SOLN: 2.0000 [drp] | Freq: Four times a day (QID) | OPHTHALMIC | Status: DC

## 2015-11-08 MED ORDER — TETRACAINE HCL 0.5 % OP SOLN
1.0000 [drp] | Freq: Once | OPHTHALMIC | Status: AC
  Administered 2015-11-08: 1 [drp] via OPHTHALMIC
  Filled 2015-11-08: qty 4

## 2015-11-08 NOTE — ED Provider Notes (Signed)
CSN: [id number]     Arrival date & time 11/08/15  [id number] History   First MD Initiated Contact with Patient 11/08/15 [phone]     Chief Complaint  Patient presents with  . Eye Drainage   Patient is a 82 y.o. male presenting with eye problem.  Eye Problem Location:  R eye Quality:  Foreign body sensation Severity:  Mild Duration:  1 day Timing:  Intermittent Progression:  Unchanged Context: not chemical exposure, not contact lens problem, not direct trauma, not foreign body and not scratch   Relieved by:  Flushing Ineffective treatments:  Flushing Associated symptoms: blurred vision (resolves with flushing), crusting, discharge, foreign body sensation and itching   Associated symptoms: no decreased vision, no double vision, no facial rash, no headaches, no nausea, no photophobia, no redness and no vomiting   Risk factors: not exposed to pinkeye    George Cain is an 82 year old male presenting with eye crusting and drainage. Patient states that he had the sensation of foreign body in his eye yesterday with itching. He states it felt like there was an eyelash in his eye that he could not get out. He woke twice during the night and again this morning with thick yellow eye drainage and crusting of his eyelashes. He states that his eye was crusted shut and he had to manually open his eyelids. Pt states he had blurred vision in his eye with the drainage but this resolved after rinsing the eye with water. Denies red eye, eye pain, loss of vision, double vision, redness of the skin around the eye, trauma to the eye, possible foreign body exposure, fevers, recent URI or any other associated symptoms. Denies sick contacts with similar symptoms. Pt resides in [facility] facility.   Past Medical History  Diagnosis Date  . CHF (congestive heart failure) ([place])   . Allergic rhinitis   . BPH (benign prostatic hypertrophy)   . PAF (paroxysmal atrial fibrillation) ([place])     Patient refused anticoagulation  .  Hyperlipidemia     Patient refused statins  . Coronary artery disease 2011    nonobstructive ASCAD with 20% LAD s   Past Surgical History  Procedure Laterality Date  . Colonoscopy  06/15/2010  . Radiofrequency ablation  08/23/12  . Cardiac catheterization  2011    Nonobstructive ASCAD w 20% LAD  . Tonsillectomy  1940    as child   Family History  Problem Relation Age of Onset  . Hypertension Mother   . Heart attack Father   . CAD Father   . Diabetes Father   . Prostate cancer Father    Social History  Substance Use Topics  . Smoking status: Never Smoker   . Smokeless tobacco: Never Used  . Alcohol Use: No    Review of Systems  Eyes: Positive for blurred vision (resolves with flushing), discharge and itching. Negative for double vision, photophobia and redness.  Gastrointestinal: Negative for nausea and vomiting.  Neurological: Negative for headaches.  All other systems reviewed and are negative.     Allergies  Keflex  Home Medications   Prior to Admission medications   Medication Sig Start Date End Date Taking? Authorizing Provider  B Complex-C (SUPER B COMPLEX/VITAMIN C) TABS Take 1 tablet by mouth daily.    Historical Provider, MD  carboxymethylcellulose 1 % ophthalmic solution Apply 1 drop to eye 2 (two) times daily.     Historical Provider, MD  cetirizine (ZYRTEC) 10 MG tablet Take 10 mg by mouth  daily.    Historical Provider, MD  cyclobenzaprine (FLEXERIL) 10 MG tablet Take 10 mg by mouth 3 (three) times daily as needed for muscle spasms.     Historical Provider, MD  diphenhydramine-acetaminophen (TYLENOL PM) 25-500 MG TABS Take 2 tablets by mouth 3 (three) times daily.     Historical Provider, MD  finasteride (PROSCAR) 5 MG tablet Take 5 mg by mouth daily.    Historical Provider, MD  flunisolide (NASALIDE) 25 MCG/ACT (0.025%) SOLN Place 2 sprays into the nose 2 (two) times daily.    Historical Provider, MD  fluticasone (FLONASE) 50 MCG/ACT nasal spray Place 2  sprays into both nostrils daily.    Historical Provider, MD  magnesium oxide (MAG-OX) 400 MG tablet Take 400 mg by mouth 2 (two) times daily.    Historical Provider, MD  Multiple Vitamin (MULTIVITAMIN WITH MINERALS) TABS tablet Take 1 tablet by mouth daily.    Historical Provider, MD  polyethylene glycol (MIRALAX / GLYCOLAX) packet Take 17 g by mouth daily as needed for moderate constipation.    Historical Provider, MD  potassium gluconate 595 MG TABS tablet Take 595 mg by mouth daily.     Historical Provider, MD  sotalol (BETAPACE) 80 MG tablet Take 0.5 tablets (40 mg total) by mouth 2 (two) times daily. 12/02/14   Sueanne Margarita, MD  trimethoprim-polymyxin b (POLYTRIM) ophthalmic solution Place 2 drops into the right eye every 6 (six) hours. Use for 5-7 days 11/08/15   Ishaq Maffei, PA-C   BP 161/77 mmHg  Pulse 53  Temp(Src) 97.9 F (36.6 C) (Oral)  Resp 18  SpO2 98% Physical Exam  Constitutional: He appears well-developed and well-nourished. No distress.  HENT:  Head: Normocephalic and atraumatic.  Eyes: Conjunctivae and EOM are normal. Pupils are equal, round, and reactive to light. Right eye exhibits discharge. No foreign body present in the right eye. Left eye exhibits no discharge. No foreign body present in the left eye. Right conjunctiva is not injected. Left conjunctiva is not injected. No scleral icterus.  Slit lamp exam:      The right eye shows no corneal abrasion, no corneal flare, no foreign body, no fluorescein uptake and no anterior chamber bulge.  Small amount of discharge noted to right eye. No eyelash crusting. No conjunctival injection. EOM intact and non-painful. PERRL. No periorbital erythema or swelling. No fluorescein uptake.     Neck: Normal range of motion.  Cardiovascular: Normal rate and regular rhythm.   Pulmonary/Chest: Effort normal. No respiratory distress.  Musculoskeletal: Normal range of motion.  Neurological: He is alert. Coordination normal.  Skin: Skin  is warm and dry.  Psychiatric: He has a normal mood and affect. His behavior is normal.  Nursing note and vitals reviewed.   ED Course  Procedures (including critical care time) Labs Review Labs Reviewed - No data to display  Imaging Review No results found. I have personally reviewed and evaluated these images and lab results as part of my medical decision-making.   EKG Interpretation None        Visual Acuity  Right Eye Distance: 20/30 Left Eye Distance: 20/40 Bilateral Distance: 20/30   MDM   Final diagnoses:  Bacterial conjunctivitis of right eye   George Cain presents with symptoms consistent with bacterial conjunctivitis. Small amount of discharge noted on exam. No conjunctival injection currently. No corneal abrasions or dendritic staining with fluorescein study. EOM non-painful with no concern for entrapment. No evidence of preseptal or orbital cellulitis. Pt is not  a contact lens wearer. Patient will be given polytrim q6h for 5 days.  Personal hygiene and frequent handwashing discussed.  Patient advised to followup with ophthalmologist for reevaluation in the next few days. Patient verbalizes understanding and is agreeable with discharge.     Josephina Gip, PA-C 11/08/15 [place], MD 11/08/15 1422

## 2015-11-08 NOTE — ED Notes (Signed)
Pt reports his right eye was itching yesterday, this am awoke with yellow drainage and crusting to right eye. Denies any injury or trauma.

## 2015-11-08 NOTE — Discharge Instructions (Signed)
Use the polytrim drops every 6 hours for 5 to 7 days. Wash your hands frequently. Follow up with your eye doctor if your symptoms do not improve in the next 2-3 days.

## 2015-11-12 DIAGNOSIS — H1033 Unspecified acute conjunctivitis, bilateral: Secondary | ICD-10-CM | POA: Diagnosis not present

## 2015-11-14 DIAGNOSIS — H1033 Unspecified acute conjunctivitis, bilateral: Secondary | ICD-10-CM | POA: Diagnosis not present

## 2015-11-17 ENCOUNTER — Other Ambulatory Visit: Payer: Self-pay | Admitting: Cardiology

## 2015-11-21 DIAGNOSIS — H1033 Unspecified acute conjunctivitis, bilateral: Secondary | ICD-10-CM | POA: Diagnosis not present

## 2015-11-28 DIAGNOSIS — Z833 Family history of diabetes mellitus: Secondary | ICD-10-CM | POA: Diagnosis not present

## 2015-11-28 DIAGNOSIS — H25012 Cortical age-related cataract, left eye: Secondary | ICD-10-CM | POA: Diagnosis not present

## 2015-11-28 DIAGNOSIS — H2511 Age-related nuclear cataract, right eye: Secondary | ICD-10-CM | POA: Diagnosis not present

## 2015-11-28 DIAGNOSIS — I2510 Atherosclerotic heart disease of native coronary artery without angina pectoris: Secondary | ICD-10-CM | POA: Diagnosis not present

## 2015-11-28 DIAGNOSIS — H1033 Unspecified acute conjunctivitis, bilateral: Secondary | ICD-10-CM | POA: Diagnosis not present

## 2015-11-28 DIAGNOSIS — Z01818 Encounter for other preprocedural examination: Secondary | ICD-10-CM | POA: Diagnosis not present

## 2015-11-28 DIAGNOSIS — L570 Actinic keratosis: Secondary | ICD-10-CM | POA: Diagnosis not present

## 2015-11-28 DIAGNOSIS — H25011 Cortical age-related cataract, right eye: Secondary | ICD-10-CM | POA: Diagnosis not present

## 2015-11-28 DIAGNOSIS — H2512 Age-related nuclear cataract, left eye: Secondary | ICD-10-CM | POA: Diagnosis not present

## 2015-11-28 DIAGNOSIS — L814 Other melanin hyperpigmentation: Secondary | ICD-10-CM | POA: Diagnosis not present

## 2015-11-28 DIAGNOSIS — I480 Paroxysmal atrial fibrillation: Secondary | ICD-10-CM | POA: Diagnosis not present

## 2015-11-28 DIAGNOSIS — Z83518 Family history of other specified eye disorder: Secondary | ICD-10-CM | POA: Diagnosis not present

## 2015-12-08 DIAGNOSIS — H25012 Cortical age-related cataract, left eye: Secondary | ICD-10-CM | POA: Diagnosis not present

## 2015-12-08 DIAGNOSIS — H2512 Age-related nuclear cataract, left eye: Secondary | ICD-10-CM | POA: Diagnosis not present

## 2015-12-22 DIAGNOSIS — H2511 Age-related nuclear cataract, right eye: Secondary | ICD-10-CM | POA: Diagnosis not present

## 2016-01-05 DIAGNOSIS — H2511 Age-related nuclear cataract, right eye: Secondary | ICD-10-CM | POA: Diagnosis not present

## 2016-01-05 DIAGNOSIS — H25011 Cortical age-related cataract, right eye: Secondary | ICD-10-CM | POA: Diagnosis not present

## 2016-01-08 DIAGNOSIS — M9901 Segmental and somatic dysfunction of cervical region: Secondary | ICD-10-CM | POA: Diagnosis not present

## 2016-01-08 DIAGNOSIS — M545 Low back pain: Secondary | ICD-10-CM | POA: Diagnosis not present

## 2016-01-08 DIAGNOSIS — M9903 Segmental and somatic dysfunction of lumbar region: Secondary | ICD-10-CM | POA: Diagnosis not present

## 2016-01-20 DIAGNOSIS — R946 Abnormal results of thyroid function studies: Secondary | ICD-10-CM | POA: Diagnosis not present

## 2016-01-20 DIAGNOSIS — I4891 Unspecified atrial fibrillation: Secondary | ICD-10-CM | POA: Diagnosis not present

## 2016-01-20 DIAGNOSIS — E785 Hyperlipidemia, unspecified: Secondary | ICD-10-CM | POA: Diagnosis not present

## 2016-01-23 DIAGNOSIS — Z23 Encounter for immunization: Secondary | ICD-10-CM | POA: Diagnosis not present

## 2016-01-23 DIAGNOSIS — Z0000 Encounter for general adult medical examination without abnormal findings: Secondary | ICD-10-CM | POA: Diagnosis not present

## 2016-01-23 DIAGNOSIS — Z125 Encounter for screening for malignant neoplasm of prostate: Secondary | ICD-10-CM | POA: Diagnosis not present

## 2016-01-23 DIAGNOSIS — Z1389 Encounter for screening for other disorder: Secondary | ICD-10-CM | POA: Diagnosis not present

## 2016-01-23 DIAGNOSIS — N400 Enlarged prostate without lower urinary tract symptoms: Secondary | ICD-10-CM | POA: Diagnosis not present

## 2016-01-23 DIAGNOSIS — J309 Allergic rhinitis, unspecified: Secondary | ICD-10-CM | POA: Diagnosis not present

## 2016-01-23 DIAGNOSIS — N183 Chronic kidney disease, stage 3 (moderate): Secondary | ICD-10-CM | POA: Diagnosis not present

## 2016-01-23 DIAGNOSIS — D509 Iron deficiency anemia, unspecified: Secondary | ICD-10-CM | POA: Diagnosis not present

## 2016-01-23 DIAGNOSIS — I4891 Unspecified atrial fibrillation: Secondary | ICD-10-CM | POA: Diagnosis not present

## 2016-03-25 ENCOUNTER — Telehealth: Payer: Self-pay | Admitting: Cardiology

## 2016-03-25 NOTE — Telephone Encounter (Signed)
New message       1. .What dental office are you calling from? Dr Jetta Lout  2. What is your office phone and fax number?  Fax [phone]  What type of procedure is the patient having performed? extraction with bone graft What date is procedure scheduled?04-01-16 3. What is your question (ex. Antibiotics prior to procedure, holding medication-we need to know how long dentist wants pt to hold med)?  Can pt have IV sedation? And, clearance was faxed yesterday.  Please disregard psychiatric diagnosis request on form, it should be cardiac diagnosis.

## 2016-03-25 NOTE — Telephone Encounter (Signed)
Received clearance request from [facility] (Dr. Arnetha Courser, DDS) for extraction of a lower right molar with socket preservation bone graft- for future dental transplant scheduled 04/01/16. They would like to know: 1) is there any contraindication to sedation? 2) are there any contraindications to epinephrine? 3) low, medium or high risk from cardiac standpoint 4) confirmation of patient's cardiac diagnosis

## 2016-03-28 NOTE — Telephone Encounter (Signed)
The patient has nonobstructive CAD by cath.  He has paroxysmal atrial fibrillation and has been maintaining NSR.  He is at low risk from cardiac standpoint for dental surgery.  Risks of epinephrine are mainly triggering afib.  OK to hold Eliquis for 48 hours prior to dental surgery if needed.

## 2016-03-29 NOTE — Telephone Encounter (Signed)
Forwarded to Dr. Jetta Lout at fax: [phone].

## 2016-03-30 DIAGNOSIS — M545 Low back pain: Secondary | ICD-10-CM | POA: Diagnosis not present

## 2016-03-30 DIAGNOSIS — M9901 Segmental and somatic dysfunction of cervical region: Secondary | ICD-10-CM | POA: Diagnosis not present

## 2016-03-30 DIAGNOSIS — M9903 Segmental and somatic dysfunction of lumbar region: Secondary | ICD-10-CM | POA: Diagnosis not present

## 2016-04-22 DIAGNOSIS — M9903 Segmental and somatic dysfunction of lumbar region: Secondary | ICD-10-CM | POA: Diagnosis not present

## 2016-04-22 DIAGNOSIS — M9901 Segmental and somatic dysfunction of cervical region: Secondary | ICD-10-CM | POA: Diagnosis not present

## 2016-04-22 DIAGNOSIS — M545 Low back pain: Secondary | ICD-10-CM | POA: Diagnosis not present

## 2016-04-27 DIAGNOSIS — M545 Low back pain: Secondary | ICD-10-CM | POA: Diagnosis not present

## 2016-04-27 DIAGNOSIS — M9903 Segmental and somatic dysfunction of lumbar region: Secondary | ICD-10-CM | POA: Diagnosis not present

## 2016-04-27 DIAGNOSIS — M9901 Segmental and somatic dysfunction of cervical region: Secondary | ICD-10-CM | POA: Diagnosis not present

## 2016-05-05 DIAGNOSIS — M9901 Segmental and somatic dysfunction of cervical region: Secondary | ICD-10-CM | POA: Diagnosis not present

## 2016-05-05 DIAGNOSIS — M545 Low back pain: Secondary | ICD-10-CM | POA: Diagnosis not present

## 2016-05-05 DIAGNOSIS — M9903 Segmental and somatic dysfunction of lumbar region: Secondary | ICD-10-CM | POA: Diagnosis not present

## 2016-06-10 DIAGNOSIS — K219 Gastro-esophageal reflux disease without esophagitis: Secondary | ICD-10-CM | POA: Diagnosis not present

## 2016-06-10 DIAGNOSIS — J310 Chronic rhinitis: Secondary | ICD-10-CM | POA: Diagnosis not present

## 2016-06-10 DIAGNOSIS — H903 Sensorineural hearing loss, bilateral: Secondary | ICD-10-CM | POA: Diagnosis not present

## 2016-06-10 DIAGNOSIS — Z974 Presence of external hearing-aid: Secondary | ICD-10-CM | POA: Diagnosis not present

## 2016-07-28 DIAGNOSIS — M545 Low back pain: Secondary | ICD-10-CM | POA: Diagnosis not present

## 2016-07-28 DIAGNOSIS — M9903 Segmental and somatic dysfunction of lumbar region: Secondary | ICD-10-CM | POA: Diagnosis not present

## 2016-07-28 DIAGNOSIS — M9901 Segmental and somatic dysfunction of cervical region: Secondary | ICD-10-CM | POA: Diagnosis not present

## 2016-08-02 DIAGNOSIS — M9903 Segmental and somatic dysfunction of lumbar region: Secondary | ICD-10-CM | POA: Diagnosis not present

## 2016-08-02 DIAGNOSIS — M9901 Segmental and somatic dysfunction of cervical region: Secondary | ICD-10-CM | POA: Diagnosis not present

## 2016-08-02 DIAGNOSIS — M545 Low back pain: Secondary | ICD-10-CM | POA: Diagnosis not present

## 2016-08-18 ENCOUNTER — Encounter: Payer: Self-pay | Admitting: *Deleted

## 2016-09-06 DIAGNOSIS — M9903 Segmental and somatic dysfunction of lumbar region: Secondary | ICD-10-CM | POA: Diagnosis not present

## 2016-09-06 DIAGNOSIS — M545 Low back pain: Secondary | ICD-10-CM | POA: Diagnosis not present

## 2016-09-06 DIAGNOSIS — M9901 Segmental and somatic dysfunction of cervical region: Secondary | ICD-10-CM | POA: Diagnosis not present

## 2016-09-07 ENCOUNTER — Encounter: Payer: Self-pay | Admitting: Cardiology

## 2016-09-07 ENCOUNTER — Ambulatory Visit (INDEPENDENT_AMBULATORY_CARE_PROVIDER_SITE_OTHER): Payer: Medicare Other | Admitting: Cardiology

## 2016-09-07 VITALS — BP 150/86 | HR 41 | Ht 68.0 in | Wt 173.0 lb

## 2016-09-07 DIAGNOSIS — I480 Paroxysmal atrial fibrillation: Secondary | ICD-10-CM | POA: Diagnosis not present

## 2016-09-07 DIAGNOSIS — I2510 Atherosclerotic heart disease of native coronary artery without angina pectoris: Secondary | ICD-10-CM

## 2016-09-07 DIAGNOSIS — E785 Hyperlipidemia, unspecified: Secondary | ICD-10-CM

## 2016-09-07 NOTE — Progress Notes (Signed)
Cardiology Office Note    Date:  09/07/2016   ID:  George Cain, DOB 1929-08-07, MRN [id number]  PCP:  [name], MD  Cardiologist:  Fransico Him, MD   Chief Complaint  Patient presents with  . Coronary Artery Disease  . Atrial Fibrillation  . Hyperlipidemia    History of Present Illness:  George Cain is a 83 y.o. male with a history of PAF s/p RFA 07/2012, nonobstructive ASCAD with 20% LAD by cath 2011and dyslipidemia.  He presents today for followup and  is doing well. He denies any chest pain, SOB, DOE, PND, orthopnea, LE edema, palpitations, dizziness or syncope.    Past Medical History:  Diagnosis Date  . Allergic rhinitis   . BPH (benign prostatic hypertrophy)   . CHF (congestive heart failure) ([place])   . Coronary artery disease 2011   nonobstructive ASCAD with 20% LAD s  . Hyperlipidemia    Patient refused statins  . PAF (paroxysmal atrial fibrillation) [facility])    Patient refused anticoagulation    Past Surgical History:  Procedure Laterality Date  . CARDIAC CATHETERIZATION  2011   Nonobstructive ASCAD w 20% LAD  . COLONOSCOPY  06/15/2010  . RADIOFREQUENCY ABLATION  08/23/12  . TONSILLECTOMY  1940   as child    Current Medications: Current Meds  Medication Sig  . aspirin EC 81 MG tablet Take 81 mg by mouth daily.  . B Complex-C (SUPER B COMPLEX/VITAMIN C) TABS Take 1 tablet by mouth daily.  . cyclobenzaprine (FLEXERIL) 10 MG tablet Take 10 mg by mouth 3 (three) times daily as needed for muscle spasms.   . finasteride (PROSCAR) 5 MG tablet Take 5 mg by mouth daily.  . flunisolide (NASALIDE) 25 MCG/ACT (0.025%) SOLN Place 2 sprays into the nose 2 (two) times daily.  . fluticasone (FLONASE) 50 MCG/ACT nasal spray Place 2 sprays into both nostrils daily.  Marland Kitchen ipratropium (ATROVENT HFA) 17 MCG/ACT inhaler Inhale 2 puffs into the lungs as directed.  . magnesium oxide (MAG-OX) 400 MG tablet Take 400 mg by mouth 2 (two) times daily.  . Multiple Vitamin  (MULTIVITAMIN WITH MINERALS) TABS tablet Take 1 tablet by mouth daily.  . Multiple Vitamins-Minerals (ICAPS AREDS 2 PO) Take by mouth daily.  . Omega-3 Fatty Acids (FISH OIL) 1000 MG CAPS Take 1,000 mg by mouth daily.  Marland Kitchen omeprazole (PRILOSEC) 40 MG capsule Take 40 mg by mouth daily.  . polyethylene glycol (MIRALAX / GLYCOLAX) packet Take 17 g by mouth daily as needed for moderate constipation.  . potassium gluconate 595 MG TABS tablet Take 595 mg by mouth daily.   . sotalol (BETAPACE) 80 MG tablet TAKE 0.5 TABLETS (40 MG TOTAL) BY MOUTH 2 (TWO) TIMES DAILY.  . [DISCONTINUED] cetirizine (ZYRTEC) 10 MG tablet Take 10 mg by mouth daily.  . [DISCONTINUED] diphenhydramine-acetaminophen (TYLENOL PM) 25-500 MG TABS Take 2 tablets by mouth 3 (three) times daily.   . [DISCONTINUED] trimethoprim-polymyxin b (POLYTRIM) ophthalmic solution Place 2 drops into the right eye every 6 (six) hours. Use for 5-7 days    Allergies:   Keflex [cephalexin]   Social History   Social History  . Marital status: Married    Spouse name: N/A  . Number of children: N/A  . Years of education: N/A   Social History Main Topics  . Smoking status: Never Smoker  . Smokeless tobacco: Never Used  . Alcohol use No  . Drug use: No  . Sexual activity: Not Asked   Other Topics  Concern  . None   Social History Narrative  . None     Family History:  The patient's family history includes CAD in his father; Diabetes in his father; Heart attack in his father; Hypertension in his mother; Prostate cancer in his father.   ROS:   Please see the history of present illness.    ROS All other systems reviewed and are negative.  No flowsheet data found.     PHYSICAL EXAM:   VS:  BP (!) 150/86   Pulse (!) 41   Ht 5\' 8"  (1.727 m)   Wt 173 lb (78.5 kg)   BMI 26.30 kg/m    GEN: Well nourished, well developed, in no acute distress  HEENT: normal  Neck: no JVD, carotid bruits, or masses Cardiac: RRR; no murmurs, rubs, or  gallops,no edema.  Intact distal pulses bilaterally.  Respiratory:  clear to auscultation bilaterally, normal work of breathing GI: soft, nontender, nondistended, + BS MS: no deformity or atrophy  Skin: warm and dry, no rash Neuro:  Alert and Oriented x 3, Strength and sensation are intact Psych: euthymic mood, full affect  Wt Readings from Last 3 Encounters:  09/07/16 173 lb (78.5 kg)  09/24/15 174 lb 9.6 oz (79.2 kg)  04/29/15 175 lb 9.6 oz (79.7 kg)      Studies/Labs Reviewed:   EKG:  EKG is ordered today.  The ekg ordered today demonstrates sinusb radycardia at 41bpm with normal intervals.    Recent Labs: 09/25/2015: ALT 16   Lipid Panel    Component Value Date/Time   CHOL 176 09/25/2015 1011   TRIG 111 09/25/2015 1011   HDL 68 09/25/2015 1011   CHOLHDL 2.6 09/25/2015 1011   VLDL 22 09/25/2015 1011   LDLCALC 86 09/25/2015 1011    Additional studies/ records that were reviewed today include:  none    ASSESSMENT:    1. Coronary artery disease involving native coronary artery of native heart without angina pectoris   2. PAF (paroxysmal atrial fibrillation) ([place])   3. Dyslipidemia      PLAN:  In order of problems listed above:  1. ASCAD - nonobstructive with 20% LAD.  He has not had any anginal symptoms.  Since he is not on NOAC, I have asked him to restart ASA 81mg  daily.  He has refused statin therapy in the past.  2. PAF s/p ablation with no reoccurence of his afib.  His CHADS2VASC score is 4.  EKG shows NSR with normal QTc.  It was recommended several years ago by Dr. Caryl Comes to go on Apixaban but he subsequently refused to get it filled.  He understands the risk of CVA and is willing to accept this risk. He is very bradycardic on exam today.  He has not had any symptoms of dizziness or syncope.  Given how bradycardic he is, I think we need to stop his sotolol.  If he has recurrent afib, may need to consider Tikosyn or Multaq. I will have him followup with Dr. Caryl Comes  for further guidance.  3. Dyslipidemia - LDL goal < 70.  I will get an FLP and ALT.  He has refused statin therapy in the past.   4.   Asymptomatic bradycardia - drug induced.      Medication Adjustments/Labs and Tests Ordered: Current medicines are reviewed at length with the patient today.  Concerns regarding medicines are outlined above.  Medication changes, Labs and Tests ordered today are listed in the Patient Instructions below.  There are no Patient Instructions on file for this visit.   Signed, Fransico Him, MD  09/07/2016 3:55 PM    [address], [place], [place]  [address] Phone: [phone]; Fax: [phone]

## 2016-09-07 NOTE — Patient Instructions (Signed)
Medication Instructions:  1) STOP SOTOLOL  Labwork: None  Testing/Procedures: None  Follow-Up: You have an appointment scheduled with Dr. Mickeal Needy at 11:00AM.  Your physician wants you to follow-up in: 6 months with Dr. Radford Pax. You will receive a reminder letter in the mail two months in advance. If you don't receive a letter, please call our office to schedule the follow-up appointment.   Any Other Special Instructions Will Be Listed Below (If Applicable).     If you need a refill on your cardiac medications before your next appointment, please call your pharmacy.

## 2016-09-08 ENCOUNTER — Encounter: Payer: Self-pay | Admitting: Internal Medicine

## 2016-09-08 ENCOUNTER — Ambulatory Visit (INDEPENDENT_AMBULATORY_CARE_PROVIDER_SITE_OTHER): Payer: Medicare Other | Admitting: Internal Medicine

## 2016-09-08 VITALS — BP 152/72 | HR 48 | Ht 68.0 in | Wt 173.0 lb

## 2016-09-08 DIAGNOSIS — I2510 Atherosclerotic heart disease of native coronary artery without angina pectoris: Secondary | ICD-10-CM

## 2016-09-08 DIAGNOSIS — I480 Paroxysmal atrial fibrillation: Secondary | ICD-10-CM | POA: Diagnosis not present

## 2016-09-08 DIAGNOSIS — R001 Bradycardia, unspecified: Secondary | ICD-10-CM | POA: Diagnosis not present

## 2016-09-08 NOTE — Progress Notes (Signed)
Patient Care Team: [name], MD as PCP - General (Family Medicine) Sueanne Margarita, MD as Consulting Physician (Cardiology)   HPI  George Cain is a 83 y.o. male Seen in follow-up for atrial fibrillation  He has declined anticoagulation in the past. He takes aspirin   He saw Dr. Eugene Cain 09/07/16 she was concerned about bradycardia and thought it was time to stop sotalol. These notes were reviewed  ECG sinus rhythm at 40  He has had no associated symptoms. He denies exercise intolerance lightheadedness shortness of breath.    Nonobstructive coronary artery disease by catheterization  Records and Results Reviewed Myoview 2014 normal LV function  Past Medical History:  Diagnosis Date  . Allergic rhinitis   . BPH (benign prostatic hypertrophy)   . CHF (congestive heart failure) ([place])   . Coronary artery disease 2011   nonobstructive ASCAD with 20% LAD s  . Hyperlipidemia    Patient refused statins  . PAF (paroxysmal atrial fibrillation) [facility])    Patient refused anticoagulation    Past Surgical History:  Procedure Laterality Date  . CARDIAC CATHETERIZATION  2011   Nonobstructive ASCAD w 20% LAD  . COLONOSCOPY  06/15/2010  . RADIOFREQUENCY ABLATION  08/23/12  . TONSILLECTOMY  1940   as child    Current Outpatient Prescriptions  Medication Sig Dispense Refill  . aspirin EC 81 MG tablet Take 81 mg by mouth daily.    . B Complex-C (SUPER B COMPLEX/VITAMIN C) TABS Take 1 tablet by mouth daily.    . cyclobenzaprine (FLEXERIL) 10 MG tablet Take 10 mg by mouth 3 (three) times daily as needed for muscle spasms.     . finasteride (PROSCAR) 5 MG tablet Take 5 mg by mouth daily.    . flunisolide (NASALIDE) 25 MCG/ACT (0.025%) SOLN Place 2 sprays into the nose 2 (two) times daily.    . fluticasone (FLONASE) 50 MCG/ACT nasal spray Place 2 sprays into both nostrils daily.    Marland Kitchen ipratropium (ATROVENT HFA) 17 MCG/ACT inhaler Inhale 2 puffs into the lungs as directed.    .  magnesium oxide (MAG-OX) 400 MG tablet Take 400 mg by mouth 2 (two) times daily.    . Multiple Vitamin (MULTIVITAMIN WITH MINERALS) TABS tablet Take 1 tablet by mouth daily.    . Multiple Vitamins-Minerals (ICAPS AREDS 2 PO) Take by mouth daily.    . Omega-3 Fatty Acids (FISH OIL) 1000 MG CAPS Take 1,000 mg by mouth daily.    Marland Kitchen omeprazole (PRILOSEC) 40 MG capsule Take 40 mg by mouth daily.    . polyethylene glycol (MIRALAX / GLYCOLAX) packet Take 17 g by mouth daily as needed for moderate constipation.    . potassium gluconate 595 MG TABS tablet Take 595 mg by mouth daily.      No current facility-administered medications for this visit.     Allergies  Allergen Reactions  . Keflex [Cephalexin] Diarrhea, Nausea Only, Other (See Comments) and Nausea And Vomiting    Night sweats also Fever, chills diarrhea Night sweats also Fever, chills diarrhea Night sweats also      Review of Systems negative except from HPI and PMH  Physical Exam BP (!) 152/72   Pulse (!) 48   Ht 5\' 8"  (1.727 m)   Wt 173 lb (78.5 kg)   SpO2 98%   BMI 26.30 kg/m  Well developed and nourished in no acute distress HENT normal Neck supple with JVP-flat Clear Regular but slworate and rhythm, no  murmurs or gallops Abd-soft with active BS No Clubbing cyanosis edema Skin-warm and dry A & Oriented  Grossly normal sensory and motor function  ECG personally reviewed from yesterday demonstrated sinus rhythm at 41 Intervals 17/10/37   Assessment and  Plan    atrial fibrillation-paroxysmal   Sinus bradycardia   CHADS-VASc score 2-age 4- HTN   dyslipidemia-mild   Patient has paroxysmal atrial fibrillation. We will look to see the risk of recurrence and the frequency of recurrence with the discontinuation of the sotalol, a recommendation with which I agree. I think there may be some increased proarrhythmia risks with sotalol was slower heart rates.  We had a lengthy discussion regarding anticoagulation.  Based on your P guidelines, based on the days registry, based on the [place] guidelines, I think that the most unfavorable treatment between anticoagulation, antiplatelets and placebo his ongoing therapy with aspirin. Will discuss this with Dr. Radford Pax. We reviewed data from [place], reviewed data from [place]  2 regarding the safety of [place] compared to aspirin and the risks of aspirin. Compared to warfarin  We spent more than 50% of our >25 min visit in face to face counseling regarding the above

## 2016-09-08 NOTE — Patient Instructions (Signed)
Medication Instructions: - Your physician recommends that you continue on your current medications as directed. Please refer to the Current Medication list given to you today.  Labwork: - none ordered  Procedures/Testing: - none ordered  Follow-Up: - Dr. Caryl Comes will see you back on as needed basis.   Any Additional Special Instructions Will Be Listed Below (If Applicable).     If you need a refill on your cardiac medications before your next appointment, please call your pharmacy.  s

## 2016-09-09 ENCOUNTER — Telehealth: Payer: Self-pay

## 2016-09-09 ENCOUNTER — Encounter: Payer: Self-pay | Admitting: Cardiology

## 2016-09-09 NOTE — Progress Notes (Signed)
Dr. Caryl Comes saw patient in consult and patient now appears to agree to start Eliqius.  Please start Eliquis 5mg  BID with no ASA.

## 2016-09-09 NOTE — Telephone Encounter (Addendum)
Sueanne Margarita, MD  Cardiology     Dr. Caryl Comes saw patient in consult and patient now appears to agree to start Eliqius.  Please start Eliquis 5mg  BID with no ASA.    Electronically signed by Sueanne Margarita, MD at 09/09/2016 5:07 PM    Patient states he will NOT start Eliquis and "does not want to be asked again." He wanted to know whether or not ASA is helping him at all since Dr. Caryl Comes seems not to think so. He also wants to know if he should continue taking Mag-ox since he only started that due to taking Sotalol, and he stopped Sotalol Tuesday.  To Dr. Radford Pax for recommendations.

## 2016-09-09 NOTE — Telephone Encounter (Signed)
This encounter was created in error - please disregard.

## 2016-09-10 NOTE — Telephone Encounter (Signed)
Please let him know that the ASA 81mg  is only for CAD and only to take if he is not taking NOAC  and will not do anything for his afib.  Please let Dr. Caryl Comes know that patient is now refusing and forward your converation with patient to him

## 2016-09-13 NOTE — Telephone Encounter (Signed)
ASA is only recommended for his CAD if he is not taking NOAC. I would have him talk with his PCP as to whether to continue on Mag oxide

## 2016-09-13 NOTE — Telephone Encounter (Signed)
Patient is aware ASA is for CAD. He wants to know if the ASA is needed at all since Dr. Caryl Comes told him it isn't.  He also needs to know if he should continue Mag-Ox since he no longer takes Sotalol.   To Dr. Radford Pax.

## 2016-09-14 NOTE — Telephone Encounter (Signed)
Please send this to Dr. Caryl Comes for review.

## 2016-09-14 NOTE — Telephone Encounter (Signed)
Patient understands he will take ASA 81 mg daily for CAD. Patient reports he felt his HR getting a "little irregular" yesterday so he restarted Sotalol 1/2 tab (40 mg) daily. He states he will continue Sotalol 40 mg daily and will continue Mag-ox instead of asking PCP since he restarted the Sotalol. Patient states he feels fine and his heart in now regular. He understands to monitor HR since bradycardia is why the medication was stopped in the first place - HR is in the 70s now.   He understands he will be called if Dr. Radford Pax has further instructions.

## 2016-09-16 NOTE — Telephone Encounter (Signed)
Thanks Richardson Landry just wanted you to be aware

## 2016-09-16 NOTE — Telephone Encounter (Signed)
George Cain  He has own opinions I'm not sure there is anything else that I can do to help. If there is, please let me know. I will let him follow-up with you.

## 2016-09-23 ENCOUNTER — Ambulatory Visit: Payer: Medicare Other | Admitting: Cardiology

## 2016-11-11 DIAGNOSIS — M545 Low back pain: Secondary | ICD-10-CM | POA: Diagnosis not present

## 2016-11-11 DIAGNOSIS — M9903 Segmental and somatic dysfunction of lumbar region: Secondary | ICD-10-CM | POA: Diagnosis not present

## 2016-11-11 DIAGNOSIS — M9901 Segmental and somatic dysfunction of cervical region: Secondary | ICD-10-CM | POA: Diagnosis not present

## 2016-11-15 DIAGNOSIS — R252 Cramp and spasm: Secondary | ICD-10-CM | POA: Diagnosis not present

## 2016-11-15 DIAGNOSIS — R42 Dizziness and giddiness: Secondary | ICD-10-CM | POA: Diagnosis not present

## 2016-11-15 DIAGNOSIS — I480 Paroxysmal atrial fibrillation: Secondary | ICD-10-CM | POA: Diagnosis not present

## 2016-11-15 DIAGNOSIS — D509 Iron deficiency anemia, unspecified: Secondary | ICD-10-CM | POA: Diagnosis not present

## 2016-11-17 DIAGNOSIS — M9903 Segmental and somatic dysfunction of lumbar region: Secondary | ICD-10-CM | POA: Diagnosis not present

## 2016-11-17 DIAGNOSIS — M545 Low back pain: Secondary | ICD-10-CM | POA: Diagnosis not present

## 2016-11-17 DIAGNOSIS — M9901 Segmental and somatic dysfunction of cervical region: Secondary | ICD-10-CM | POA: Diagnosis not present

## 2016-11-18 DIAGNOSIS — M549 Dorsalgia, unspecified: Secondary | ICD-10-CM | POA: Diagnosis not present

## 2016-11-18 DIAGNOSIS — M47816 Spondylosis without myelopathy or radiculopathy, lumbar region: Secondary | ICD-10-CM | POA: Diagnosis not present

## 2016-11-18 DIAGNOSIS — M546 Pain in thoracic spine: Secondary | ICD-10-CM | POA: Diagnosis not present

## 2016-11-18 DIAGNOSIS — M791 Myalgia: Secondary | ICD-10-CM | POA: Diagnosis not present

## 2016-11-23 DIAGNOSIS — M546 Pain in thoracic spine: Secondary | ICD-10-CM | POA: Diagnosis not present

## 2016-11-23 DIAGNOSIS — R293 Abnormal posture: Secondary | ICD-10-CM | POA: Diagnosis not present

## 2016-11-25 DIAGNOSIS — M546 Pain in thoracic spine: Secondary | ICD-10-CM | POA: Diagnosis not present

## 2016-11-25 DIAGNOSIS — R293 Abnormal posture: Secondary | ICD-10-CM | POA: Diagnosis not present

## 2016-11-26 DIAGNOSIS — L570 Actinic keratosis: Secondary | ICD-10-CM | POA: Diagnosis not present

## 2016-11-26 DIAGNOSIS — D2262 Melanocytic nevi of left upper limb, including shoulder: Secondary | ICD-10-CM | POA: Diagnosis not present

## 2016-11-26 DIAGNOSIS — D1801 Hemangioma of skin and subcutaneous tissue: Secondary | ICD-10-CM | POA: Diagnosis not present

## 2016-11-30 DIAGNOSIS — R293 Abnormal posture: Secondary | ICD-10-CM | POA: Diagnosis not present

## 2016-11-30 DIAGNOSIS — M546 Pain in thoracic spine: Secondary | ICD-10-CM | POA: Diagnosis not present

## 2016-12-02 DIAGNOSIS — R293 Abnormal posture: Secondary | ICD-10-CM | POA: Diagnosis not present

## 2016-12-02 DIAGNOSIS — M546 Pain in thoracic spine: Secondary | ICD-10-CM | POA: Diagnosis not present

## 2016-12-07 DIAGNOSIS — M546 Pain in thoracic spine: Secondary | ICD-10-CM | POA: Diagnosis not present

## 2016-12-07 DIAGNOSIS — R293 Abnormal posture: Secondary | ICD-10-CM | POA: Diagnosis not present

## 2016-12-09 DIAGNOSIS — R293 Abnormal posture: Secondary | ICD-10-CM | POA: Diagnosis not present

## 2016-12-09 DIAGNOSIS — M546 Pain in thoracic spine: Secondary | ICD-10-CM | POA: Diagnosis not present

## 2016-12-14 DIAGNOSIS — M546 Pain in thoracic spine: Secondary | ICD-10-CM | POA: Diagnosis not present

## 2016-12-14 DIAGNOSIS — R293 Abnormal posture: Secondary | ICD-10-CM | POA: Diagnosis not present

## 2016-12-16 DIAGNOSIS — R293 Abnormal posture: Secondary | ICD-10-CM | POA: Diagnosis not present

## 2016-12-16 DIAGNOSIS — M546 Pain in thoracic spine: Secondary | ICD-10-CM | POA: Diagnosis not present

## 2016-12-21 DIAGNOSIS — R293 Abnormal posture: Secondary | ICD-10-CM | POA: Diagnosis not present

## 2016-12-21 DIAGNOSIS — M546 Pain in thoracic spine: Secondary | ICD-10-CM | POA: Diagnosis not present

## 2016-12-23 DIAGNOSIS — M546 Pain in thoracic spine: Secondary | ICD-10-CM | POA: Diagnosis not present

## 2016-12-23 DIAGNOSIS — R293 Abnormal posture: Secondary | ICD-10-CM | POA: Diagnosis not present

## 2016-12-28 DIAGNOSIS — M546 Pain in thoracic spine: Secondary | ICD-10-CM | POA: Diagnosis not present

## 2016-12-28 DIAGNOSIS — R293 Abnormal posture: Secondary | ICD-10-CM | POA: Diagnosis not present

## 2017-01-03 DIAGNOSIS — M546 Pain in thoracic spine: Secondary | ICD-10-CM | POA: Diagnosis not present

## 2017-01-03 DIAGNOSIS — R293 Abnormal posture: Secondary | ICD-10-CM | POA: Diagnosis not present

## 2017-01-07 DIAGNOSIS — M47816 Spondylosis without myelopathy or radiculopathy, lumbar region: Secondary | ICD-10-CM | POA: Diagnosis not present

## 2017-01-07 DIAGNOSIS — M791 Myalgia: Secondary | ICD-10-CM | POA: Diagnosis not present

## 2017-01-07 DIAGNOSIS — M47812 Spondylosis without myelopathy or radiculopathy, cervical region: Secondary | ICD-10-CM | POA: Diagnosis not present

## 2017-01-07 DIAGNOSIS — M546 Pain in thoracic spine: Secondary | ICD-10-CM | POA: Diagnosis not present

## 2017-01-25 DIAGNOSIS — J309 Allergic rhinitis, unspecified: Secondary | ICD-10-CM | POA: Diagnosis not present

## 2017-01-25 DIAGNOSIS — Z0000 Encounter for general adult medical examination without abnormal findings: Secondary | ICD-10-CM | POA: Diagnosis not present

## 2017-01-25 DIAGNOSIS — Z1389 Encounter for screening for other disorder: Secondary | ICD-10-CM | POA: Diagnosis not present

## 2017-01-25 DIAGNOSIS — N400 Enlarged prostate without lower urinary tract symptoms: Secondary | ICD-10-CM | POA: Diagnosis not present

## 2017-01-25 DIAGNOSIS — Z23 Encounter for immunization: Secondary | ICD-10-CM | POA: Diagnosis not present

## 2017-01-25 DIAGNOSIS — I480 Paroxysmal atrial fibrillation: Secondary | ICD-10-CM | POA: Diagnosis not present

## 2017-03-09 ENCOUNTER — Encounter: Payer: Self-pay | Admitting: Cardiology

## 2017-03-09 ENCOUNTER — Ambulatory Visit (INDEPENDENT_AMBULATORY_CARE_PROVIDER_SITE_OTHER): Payer: Medicare Other | Admitting: Cardiology

## 2017-03-09 ENCOUNTER — Encounter (INDEPENDENT_AMBULATORY_CARE_PROVIDER_SITE_OTHER): Payer: Self-pay

## 2017-03-09 VITALS — BP 134/82 | HR 74 | Ht 68.0 in | Wt 173.6 lb

## 2017-03-09 DIAGNOSIS — I480 Paroxysmal atrial fibrillation: Secondary | ICD-10-CM | POA: Diagnosis not present

## 2017-03-09 DIAGNOSIS — E785 Hyperlipidemia, unspecified: Secondary | ICD-10-CM

## 2017-03-09 DIAGNOSIS — I2510 Atherosclerotic heart disease of native coronary artery without angina pectoris: Secondary | ICD-10-CM | POA: Diagnosis not present

## 2017-03-09 MED ORDER — SOTALOL HCL 80 MG PO TABS: 40.0000 mg | ORAL_TABLET | ORAL | 3 refills | Status: DC

## 2017-03-09 NOTE — Patient Instructions (Signed)
Medication Instructions:  Your physician recommends that you continue on your current medications as directed. Please refer to the Current Medication list given to you today.  Labwork: None ordered   Testing/Procedures: None ordered   Follow-Up: Your physician wants you to follow-up in: 6 months with PA. You will receive a reminder letter in the mail two months in advance. If you don't receive a letter, please call our office to schedule the follow-up appointment.  Your physician wants you to follow-up in: 1 year with Dr. Radford Pax. You will receive a reminder letter in the mail two months in advance. If you don't receive a letter, please call our office to schedule the follow-up appointment.   Any Other Special Instructions Will Be Listed Below (If Applicable).     If you need a refill on your cardiac medications before your next appointment, please call your pharmacy.

## 2017-03-09 NOTE — Progress Notes (Signed)
Cardiology Office Note:    Date:  03/09/2017   ID:  George Cain, DOB Sep 04, 1929, MRN [id number]  PCP:  [name], MD  Cardiologist:  Fransico Him, MD   Referring MD: [name], MD   Chief Complaint  Patient presents with  . Follow-up    PAF, CAD    History of Present Illness:    George Cain is a 83 y.o. male with a hx of PAF s/p RFA 07/2012, nonobstructive ASCAD with 20% LAD by cath 2011and dyslipidemia.  He has declined anticoagulation in the past.  He was seen by Dr. Caryl Comes due to bradycardia on Sotolol and the sotolol was stopped.  He continued to refuse anticoagulation.  He started to have palpitations again and he restarted Sotolol at 40mg  weekly.  He says that he does not have any palpitations with once weekly dosing.   He is here today for followup and is doing well.  He denies any chest pain or pressure, SOB, DOE, PND, orthopnea, LE edema, dizziness, palpitations or syncope. He is compliant with his meds and is tolerating meds with no SE.     Past Medical History:  Diagnosis Date  . Allergic rhinitis   . BPH (benign prostatic hypertrophy)   . CHF (congestive heart failure) ([place])   . Coronary artery disease 2011   nonobstructive ASCAD with 20% LAD s  . Hyperlipidemia    Patient refused statins  . PAF (paroxysmal atrial fibrillation) [facility])    Patient refused anticoagulation    Past Surgical History:  Procedure Laterality Date  . CARDIAC CATHETERIZATION  2011   Nonobstructive ASCAD w 20% LAD  . COLONOSCOPY  06/15/2010  . RADIOFREQUENCY ABLATION  08/23/12  . TONSILLECTOMY  1940   as child    Current Medications: Current Meds  Medication Sig  . aspirin EC 81 MG tablet Take 81 mg by mouth daily.  . B Complex-C (SUPER B COMPLEX/VITAMIN C) TABS Take 1 tablet by mouth daily.  . cyclobenzaprine (FLEXERIL) 10 MG tablet Take 10 mg by mouth 3 (three) times daily as needed for muscle spasms.   . finasteride (PROSCAR) 5 MG tablet Take 5 mg by mouth daily.  .  flunisolide (NASALIDE) 25 MCG/ACT (0.025%) SOLN Place 2 sprays into the nose 2 (two) times daily.  . fluticasone (FLONASE) 50 MCG/ACT nasal spray Place 2 sprays into both nostrils daily.  Marland Kitchen ipratropium (ATROVENT HFA) 17 MCG/ACT inhaler Inhale 2 puffs into the lungs as directed.  . Multiple Vitamin (MULTIVITAMIN WITH MINERALS) TABS tablet Take 1 tablet by mouth daily.  . Multiple Vitamins-Minerals (ICAPS AREDS 2 PO) Take by mouth daily.  . Omega-3 Fatty Acids (FISH OIL) 1000 MG CAPS Take 1,000 mg by mouth daily.  . polyethylene glycol (MIRALAX / GLYCOLAX) packet Take 17 g by mouth daily as needed for moderate constipation.  . potassium gluconate 595 MG TABS tablet Take 595 mg by mouth daily.   . sotalol (BETAPACE) 80 MG tablet Take 40 mg once a week by mouth.      Allergies:   Keflex [cephalexin]   Social History   Socioeconomic History  . Marital status: Married    Spouse name: None  . Number of children: None  . Years of education: None  . Highest education level: None  Social Needs  . Financial resource strain: None  . Food insecurity - worry: None  . Food insecurity - inability: None  . Transportation needs - medical: None  . Transportation needs - non-medical: None  Occupational  History  . None  Tobacco Use  . Smoking status: Never Smoker  . Smokeless tobacco: Never Used  Substance and Sexual Activity  . Alcohol use: No    Alcohol/week: 0.0 oz  . Drug use: No  . Sexual activity: None  Other Topics Concern  . None  Social History Narrative  . None     Family History: The patient's family history includes CAD in his father; Diabetes in his father; Heart attack in his father; Hypertension in his mother; Prostate cancer in his father.  ROS:   Please see the history of present illness.    ROS  All other systems reviewed and negative.   EKGs/Labs/Other Studies Reviewed:    The following studies were reviewed today: none  EKG:  EKG is not ordered today.   Recent  Labs: No results found for requested labs within last 8760 hours.   Recent Lipid Panel    Component Value Date/Time   CHOL 176 09/25/2015 1011   TRIG 111 09/25/2015 1011   HDL 68 09/25/2015 1011   CHOLHDL 2.6 09/25/2015 1011   VLDL 22 09/25/2015 1011   LDLCALC 86 09/25/2015 1011    Physical Exam:    VS:  BP 134/82   Pulse 74   Ht 5\' 8"  (1.727 m)   Wt 173 lb 9.6 oz (78.7 kg)   BMI 26.40 kg/m     Wt Readings from Last 3 Encounters:  03/09/17 173 lb 9.6 oz (78.7 kg)  09/08/16 173 lb (78.5 kg)  09/07/16 173 lb (78.5 kg)     GEN:  Well nourished, well developed in no acute distress HEENT: Normal NECK: No JVD; No carotid bruits LYMPHATICS: No lymphadenopathy CARDIAC: RRR, no murmurs, rubs, gallops RESPIRATORY:  Clear to auscultation without rales, wheezing or rhonchi  ABDOMEN: Soft, non-tender, non-distended MUSCULOSKELETAL:  No edema; No deformity  SKIN: Warm and dry NEUROLOGIC:  Alert and oriented x 3 PSYCHIATRIC:  Normal affect   ASSESSMENT:    1. PAF (paroxysmal atrial fibrillation) ([place])   2. Coronary artery disease involving native coronary artery of native heart without angina pectoris   3. Dyslipidemia    PLAN:    In order of problems listed above:  1.  Paroxysmal atrial fibrillation - he is maintaining NSR but had some irregular beats after coming off Sotolol so he went back on Sotolol 40mg  once weekly.  He continues to refuses anticoagulation.    2.  ASCAD - cath showed nonobstructive ASCAD with 20% LAD in 2011.  He will continue on ASA 81mg  daily.   3.  Hyperlipidemia with LDL goal < 70.  He refuses statin therapy.     Medication Adjustments/Labs and Tests Ordered: Current medicines are reviewed at length with the patient today.  Concerns regarding medicines are outlined above.  No orders of the defined types were placed in this encounter.  No orders of the defined types were placed in this encounter.   Signed, Fransico Him, MD  03/09/2017 3:42  PM    [place]

## 2017-03-10 DIAGNOSIS — Z961 Presence of intraocular lens: Secondary | ICD-10-CM | POA: Diagnosis not present

## 2017-03-10 DIAGNOSIS — H35363 Drusen (degenerative) of macula, bilateral: Secondary | ICD-10-CM | POA: Diagnosis not present

## 2017-03-10 DIAGNOSIS — H318 Other specified disorders of choroid: Secondary | ICD-10-CM | POA: Diagnosis not present

## 2017-03-10 DIAGNOSIS — H02402 Unspecified ptosis of left eyelid: Secondary | ICD-10-CM | POA: Diagnosis not present

## 2017-03-10 DIAGNOSIS — H43811 Vitreous degeneration, right eye: Secondary | ICD-10-CM | POA: Diagnosis not present

## 2017-03-10 DIAGNOSIS — H35373 Puckering of macula, bilateral: Secondary | ICD-10-CM | POA: Diagnosis not present

## 2017-05-12 ENCOUNTER — Encounter (INDEPENDENT_AMBULATORY_CARE_PROVIDER_SITE_OTHER): Payer: Self-pay | Admitting: Orthopedic Surgery

## 2017-05-12 ENCOUNTER — Ambulatory Visit (INDEPENDENT_AMBULATORY_CARE_PROVIDER_SITE_OTHER): Payer: Medicare Other

## 2017-05-12 ENCOUNTER — Ambulatory Visit (INDEPENDENT_AMBULATORY_CARE_PROVIDER_SITE_OTHER): Payer: Medicare Other | Admitting: Orthopedic Surgery

## 2017-05-12 DIAGNOSIS — M545 Low back pain, unspecified: Secondary | ICD-10-CM

## 2017-05-12 NOTE — Progress Notes (Signed)
MR LUMBAR

## 2017-05-14 NOTE — Progress Notes (Signed)
Office Visit Note   Patient: George Cain           Date of Birth: 05/26/1929           MRN: [id number] Visit Date: 05/12/2017 Requested by: [name], MD [address] [place], [place] [address] PCP: [name], MD  Subjective: Chief Complaint  Patient presents with  . Lower Back - Pain    HPI: George Cain is a patient who presents for evaluation of left hip and back pain.  It is been on and off for the past year.  He did 6 weeks of physical therapy at spine and scoliosis hurts him to get out of a car.  He felt something "slight" in his back.  He rarely uses Flexeril.  His physician told him to come off of Tylenol and Motrin.  To exercise 3 times a week.              ROS: All systems reviewed are negative as they relate to the chief complaint within the history of present illness.  Patient denies  fevers or chills.   Assessment & Plan: Visit Diagnoses:  1. Left-sided low back pain without sciatica, unspecified chronicity     Plan: Impression is low back pain likely facet mediated arthritis.  Plan is MRI scan of the L-spine to evaluate left-sided low back pain.  Does not have a significant radicular component.  There is a chance that epidural steroid injections will help him.  MRI scan with likely ESI to follow.  Follow-Up Instructions: Return for after MRI.   Orders:  Orders Placed This Encounter  Procedures  . XR Lumbar Spine 2-3 Views  . MR Lumbar Spine w/o contrast   No orders of the defined types were placed in this encounter.     Procedures: No procedures performed   Clinical Data: No additional findings.  Objective: Vital Signs: There were no vitals taken for this visit.  Physical Exam:   Constitutional: Patient appears well-developed HEENT:  Head: Normocephalic Eyes:EOM are normal Neck: Normal range of motion Cardiovascular: Normal rate Pulmonary/chest: Effort normal Neurologic: Patient is alert Skin: Skin is warm Psychiatric: Patient  has normal mood and affect    Ortho Exam: Orthopedic exam demonstrates good ankle dorsiflexion plantar flexion quad and hamstring strength with no nerve root tension signs.  No trochanteric tenderness noted pedal pulses palpable.  Negative Babinski negative clonus.  No definite paresthesias L1 S1 bilaterally.  Forward and lateral bending and some pain to direct palpation in the SI joint on the left  Specialty Comments:  No specialty comments available.  Imaging: No results found.   PMFS History: Patient Active Problem List   Diagnosis Date Noted  . Dyslipidemia 01/03/2014  . PAF (paroxysmal atrial fibrillation) ([place])   . Coronary artery disease    Past Medical History:  Diagnosis Date  . Allergic rhinitis   . BPH (benign prostatic hypertrophy)   . CHF (congestive heart failure) ([place])   . Coronary artery disease 2011   nonobstructive ASCAD with 20% LAD s  . Hyperlipidemia    Patient refused statins  . PAF (paroxysmal atrial fibrillation) ([place])    Patient refused anticoagulation    Family History  Problem Relation Age of Onset  . Hypertension Mother   . Heart attack Father   . CAD Father   . Diabetes Father   . Prostate cancer Father     Past Surgical History:  Procedure Laterality Date  . CARDIAC CATHETERIZATION  2011  Nonobstructive ASCAD w 20% LAD  . COLONOSCOPY  06/15/2010  . RADIOFREQUENCY ABLATION  08/23/12  . TONSILLECTOMY  1940   as child   Social History   Occupational History  . Not on file  Tobacco Use  . Smoking status: Never Smoker  . Smokeless tobacco: Never Used  Substance and Sexual Activity  . Alcohol use: No    Alcohol/week: 0.0 oz  . Drug use: No  . Sexual activity: Not on file

## 2017-05-16 ENCOUNTER — Other Ambulatory Visit (INDEPENDENT_AMBULATORY_CARE_PROVIDER_SITE_OTHER): Payer: Self-pay | Admitting: Orthopedic Surgery

## 2017-05-21 ENCOUNTER — Ambulatory Visit
Admission: RE | Admit: 2017-05-21 | Discharge: 2017-05-21 | Disposition: A | Discharge status: Home / Self Care | Payer: Medicare Other | Source: Ambulatory Visit | Attending: Orthopedic Surgery | Admitting: Orthopedic Surgery

## 2017-05-21 DIAGNOSIS — M545 Low back pain, unspecified: Secondary | ICD-10-CM

## 2017-05-21 DIAGNOSIS — M5126 Other intervertebral disc displacement, lumbar region: Secondary | ICD-10-CM | POA: Diagnosis not present

## 2017-05-21 DIAGNOSIS — M48061 Spinal stenosis, lumbar region without neurogenic claudication: Secondary | ICD-10-CM | POA: Diagnosis not present

## 2017-05-25 ENCOUNTER — Encounter (INDEPENDENT_AMBULATORY_CARE_PROVIDER_SITE_OTHER): Payer: Self-pay | Admitting: Orthopedic Surgery

## 2017-05-25 ENCOUNTER — Ambulatory Visit (INDEPENDENT_AMBULATORY_CARE_PROVIDER_SITE_OTHER): Payer: Medicare Other | Admitting: Orthopedic Surgery

## 2017-05-25 DIAGNOSIS — M545 Low back pain, unspecified: Secondary | ICD-10-CM

## 2017-05-27 ENCOUNTER — Encounter (INDEPENDENT_AMBULATORY_CARE_PROVIDER_SITE_OTHER): Payer: Self-pay | Admitting: Orthopedic Surgery

## 2017-05-27 NOTE — Progress Notes (Signed)
Office Visit Note   Patient: George Cain           Date of Birth: Dec 25, 1929           MRN: [id number] Visit Date: 05/25/2017 Requested by: [name], MD [address], [place] [address] PCP: [name], MD  Subjective: Chief Complaint  Patient presents with  . Lower Back - Follow-up    HPI: Patient presents for evaluation of back and leg pain.  He states that his leg is actually improving.  Since I have seen him he has had an MRI scan of his lumbar spine.  He describes taking Tylenol for pain which is helping.  Overall he is improved.  MRI scan shows left-sided L3-4 subarticular recess stenosis which is mild.              ROS: All systems reviewed are negative as they relate to the chief complaint within the history of present illness.  Patient denies  fevers or chills.   Assessment & Plan: Visit Diagnoses:  1. Left-sided low back pain without sciatica, unspecified chronicity     Plan: Impression is low back pain which is improving.  His leg pain has more or less resolved.  Next intervention would be epidural steroid injection.  He does not really feel like he is symptomatic enough for that at this time.  He will call for that and I can arrange that for him over the phone to be done by Dr. Ernestina Patches.  Follow-up with me as needed.  Follow-Up Instructions: Return if symptoms worsen or fail to improve.   Orders:  No orders of the defined types were placed in this encounter.  No orders of the defined types were placed in this encounter.     Procedures: No procedures performed   Clinical Data: No additional findings.  Objective: Vital Signs: There were no vitals taken for this visit.  Physical Exam:   Constitutional: Patient appears well-developed HEENT:  Head: Normocephalic Eyes:EOM are normal Neck: Normal range of motion Cardiovascular: Normal rate Pulmonary/chest: Effort normal Neurologic: Patient is alert Skin: Skin is  warm Psychiatric: Patient has normal mood and affect    Ortho Exam: Orthopedic exam demonstrates normal gait and alignment.  No nerve root tension signs.  5 out of 5 ankle dorsiflexion plantar flexion quad and hamstring strength with no groin pain with internal/external rotation of the leg.  No paresthesias L1 S1 bilaterally.  Symmetric reflexes.  Palpable pedal pulses.  Less pain with forward and lateral bending that he had on last clinic visit.  No muscle atrophy in the legs.  Specialty Comments:  No specialty comments available.  Imaging: No results found.   PMFS History: Patient Active Problem List   Diagnosis Date Noted  . Dyslipidemia 01/03/2014  . PAF (paroxysmal atrial fibrillation) ([place])   . Coronary artery disease    Past Medical History:  Diagnosis Date  . Allergic rhinitis   . BPH (benign prostatic hypertrophy)   . CHF (congestive heart failure) ([place])   . Coronary artery disease 2011   nonobstructive ASCAD with 20% LAD s  . Hyperlipidemia    Patient refused statins  . PAF (paroxysmal atrial fibrillation) ([place])    Patient refused anticoagulation    Family History  Problem Relation Age of Onset  . Hypertension Mother   . Heart attack Father   . CAD Father   . Diabetes Father   . Prostate cancer Father     Past Surgical  History:  Procedure Laterality Date  . CARDIAC CATHETERIZATION  2011   Nonobstructive ASCAD w 20% LAD  . COLONOSCOPY  06/15/2010  . RADIOFREQUENCY ABLATION  08/23/12  . TONSILLECTOMY  1940   as child   Social History   Occupational History  . Not on file  Tobacco Use  . Smoking status: Never Smoker  . Smokeless tobacco: Never Used  Substance and Sexual Activity  . Alcohol use: No    Alcohol/week: 0.0 oz  . Drug use: No  . Sexual activity: Not on file

## 2017-06-06 ENCOUNTER — Ambulatory Visit (INDEPENDENT_AMBULATORY_CARE_PROVIDER_SITE_OTHER): Payer: Medicare Other | Admitting: Podiatry

## 2017-06-06 ENCOUNTER — Encounter: Payer: Self-pay | Admitting: Podiatry

## 2017-06-06 DIAGNOSIS — L600 Ingrowing nail: Secondary | ICD-10-CM | POA: Diagnosis not present

## 2017-06-06 NOTE — Progress Notes (Signed)
Subjective:   Patient ID: George Cain, male   DOB: 84 y.o.   MRN: [id number]   HPI Patient presents with thickened damaged incurvated third nail left that makes it hard to wear shoe gear and history of having had stents removed in the past by knee.  States is been this way for around 6 months he is tried to trim it and soak it   Review of Systems  All other systems reviewed and are negative.       Objective:  Physical Exam  Constitutional: He appears well-developed and well-nourished.  Cardiovascular: Intact distal pulses.  Pulmonary/Chest: Effort normal.  Musculoskeletal: Normal range of motion.  Neurological: He is alert.  Skin: Skin is warm.  Nursing note and vitals reviewed.   Neurovascular status intact muscle strength adequate range of motion within normal limits with patient found to have a thickened damaged third nail left is very painful upon dorsal palpation with good digital perfusion and well oriented x3     Assessment:  Chronic nail disease with ingrown component left third digit very painful when palpated     Plan:  H&P condition reviewed and patient wants to go and have this fixed and I do agree that would be in his best interest and I reviewed correction explaining procedure and risk and patient wants surgery to remove the nail permanently.  Today I went ahead and I infiltrated 60 mg like Marcaine mixture did sterile prep of the third toe using sterile instruments removed the third nail exposed matrix and applied phenol for applications 30 seconds followed by alcohol lavage sterile dressing.  Given instructions on soaks and reappoint discussed condition and the abnormal growth plate with pain.

## 2017-06-06 NOTE — Progress Notes (Signed)
   Subjective:    Patient ID: George Cain, male    DOB: 1929-11-12, 84 y.o.   MRN: [id number]  HPI    Review of Systems  All other systems reviewed and are negative.      Objective:   Physical Exam        Assessment & Plan:

## 2017-06-06 NOTE — Patient Instructions (Signed)

## 2017-06-08 ENCOUNTER — Telehealth: Payer: Self-pay | Admitting: Podiatry

## 2017-06-08 NOTE — Telephone Encounter (Signed)
I informed pt if he was able to take ibuprofen or an antiinflammatory medication he could take as package instructs. Pt states he has Aleve, I told him that would help and could take before bedtime. Pt states the toe feels much better now than last night.

## 2017-06-08 NOTE — Telephone Encounter (Signed)
I had a toenail removed by Dr. Paulla Dolly on Monday. Everything was fine Monday night but last night I could not sleep at all. I was getting like nerve pain. Please give me a call back at [phone]. Thank you.

## 2017-06-15 ENCOUNTER — Encounter: Payer: Self-pay | Admitting: Podiatry

## 2017-06-15 ENCOUNTER — Ambulatory Visit (INDEPENDENT_AMBULATORY_CARE_PROVIDER_SITE_OTHER): Payer: Medicare Other | Admitting: Podiatry

## 2017-06-15 VITALS — BP 165/98 | HR 65 | Resp 16

## 2017-06-15 DIAGNOSIS — L03032 Cellulitis of left toe: Secondary | ICD-10-CM

## 2017-06-15 DIAGNOSIS — L089 Local infection of the skin and subcutaneous tissue, unspecified: Secondary | ICD-10-CM | POA: Diagnosis not present

## 2017-06-15 MED ORDER — DOXYCYCLINE HYCLATE 100 MG PO TABS: 100.0000 mg | ORAL_TABLET | Freq: Two times a day (BID) | ORAL | 0 refills | Status: DC

## 2017-06-17 NOTE — Progress Notes (Signed)
Subjective:   Patient ID: George Cain, male   DOB: 84 y.o.   MRN: [id number]   HPI Patient presents stating he just wanted to get this to look at as he developed blisters right after the procedure he thinks maybe he had an allergic reaction   ROS      Objective:  Physical Exam  Neurovascular status is intact with the digit well perfused with irritation around the toe itself but the actual nail site is healing well and crusting over.  No proximal edema erythema or drainage noted     Assessment:  Appears to be a localized reactive process but cannot rule out possibility there may be low-grade infection which is occurred with all the irritation that he has had     Plan:  I advised on allowing it to air dry and at this time I did go ahead and placed on doxycycline 100 mg twice daily as precautionary measure.  I gave him strict instructions if this should not heal or any changes to occur he is to reappoint immediately for me to reevaluate

## 2017-08-05 DIAGNOSIS — L570 Actinic keratosis: Secondary | ICD-10-CM | POA: Diagnosis not present

## 2017-08-05 DIAGNOSIS — D485 Neoplasm of uncertain behavior of skin: Secondary | ICD-10-CM | POA: Diagnosis not present

## 2017-08-05 DIAGNOSIS — C44629 Squamous cell carcinoma of skin of left upper limb, including shoulder: Secondary | ICD-10-CM | POA: Diagnosis not present

## 2017-08-05 DIAGNOSIS — D2262 Melanocytic nevi of left upper limb, including shoulder: Secondary | ICD-10-CM | POA: Diagnosis not present

## 2017-08-05 DIAGNOSIS — L718 Other rosacea: Secondary | ICD-10-CM | POA: Diagnosis not present

## 2017-08-05 DIAGNOSIS — L814 Other melanin hyperpigmentation: Secondary | ICD-10-CM | POA: Diagnosis not present

## 2017-08-12 ENCOUNTER — Encounter: Payer: Self-pay | Admitting: Physician Assistant

## 2017-08-24 ENCOUNTER — Ambulatory Visit (INDEPENDENT_AMBULATORY_CARE_PROVIDER_SITE_OTHER): Payer: Medicare Other | Admitting: Physician Assistant

## 2017-08-24 ENCOUNTER — Encounter: Payer: Self-pay | Admitting: Physician Assistant

## 2017-08-24 ENCOUNTER — Encounter (INDEPENDENT_AMBULATORY_CARE_PROVIDER_SITE_OTHER): Payer: Self-pay

## 2017-08-24 VITALS — BP 126/72 | HR 60 | Ht 68.5 in | Wt 165.0 lb

## 2017-08-24 DIAGNOSIS — I480 Paroxysmal atrial fibrillation: Secondary | ICD-10-CM

## 2017-08-24 DIAGNOSIS — E785 Hyperlipidemia, unspecified: Secondary | ICD-10-CM

## 2017-08-24 DIAGNOSIS — I2510 Atherosclerotic heart disease of native coronary artery without angina pectoris: Secondary | ICD-10-CM

## 2017-08-24 NOTE — Patient Instructions (Signed)
Medication Instructions:  Your physician recommends that you continue on your current medications as directed. Please refer to the Current Medication list given to you today.  Labwork: NONE  Testing/Procedures: NONE  Follow-Up: Your physician wants you to follow-up in: 6 months with Dr. Turner. You will receive a reminder letter in the mail two months in advance. If you don't receive a letter, please call our office to schedule the follow-up appointment.   If you need a refill on your cardiac medications before your next appointment, please call your pharmacy.    

## 2017-08-24 NOTE — Progress Notes (Signed)
Cardiology Office Note    Date:  08/24/2017   ID:  George Cain, DOB July 04, 1929, MRN [id number]  PCP:  [name], MD  Cardiologist: Fransico Him, MD  Chief Complaint  Patient presents with  . Follow-up    History of Present Illness:  George Cain is a 84 y.o. male with history of paroxysmal atrial fibrillation status post radiofrequency ablation in 2014.  Has declined anticoagulation.  Was on sotalol which was stopped by Dr. Caryl Comes for bradycardia.  Patient started having palpitations so restarted sotalol 40 mg weekly which he says helped.  Also has nonobstructive CAD with 20% LAD on cath in 2011, normal nuclear stress test 2014 and hyperlipidemia-has refused statin therapy..  Last saw Dr. Radford Pax 03/09/2017 and was maintaining normal sinus rhythm.  Continue to refuse anticoagulation.  Maintaining himself on sotalol once weekly.  Patient comes in today for f/u. Works out in gym for 45 min 3x/week and does weights as well. Doesn't enjoy it but does it.  Still taking sotalol once a week.  He notices on the daily he takes it his heart rate does not get up as high when he works out.  He only gets up to about 104 on the days he does not take the sotalol.  He denies any further palpitations, chest pain, dyspnea, dyspnea on exertion, dizziness or presyncope.  He lives at [facility] with his wife in independent living and loves it.  He feels great for 84 years old.  He had blood work at the [place] in January but forgot to bring a copy.  Past Medical History:  Diagnosis Date  . Allergic rhinitis   . BPH (benign prostatic hypertrophy)   . CHF (congestive heart failure) ([place])   . Coronary artery disease 2011   nonobstructive ASCAD with 20% LAD s  . Hyperlipidemia    Patient refused statins  . PAF (paroxysmal atrial fibrillation) [facility])    Patient refused anticoagulation    Past Surgical History:  Procedure Laterality Date  . CARDIAC CATHETERIZATION  2011   Nonobstructive ASCAD w 20% LAD    . COLONOSCOPY  06/15/2010  . RADIOFREQUENCY ABLATION  08/23/12  . TONSILLECTOMY  1940   as child    Current Medications: Current Meds  Medication Sig  . acetaminophen (TYLENOL) 500 MG tablet Take 500 mg by mouth 2 (two) times daily.  Marland Kitchen aspirin EC 81 MG tablet Take 81 mg by mouth daily.  . B Complex-C (SUPER B COMPLEX/VITAMIN C) TABS Take 1 tablet by mouth daily.  . cetirizine (ZYRTEC) 10 MG tablet Take 10 mg by mouth daily.   . finasteride (PROSCAR) 5 MG tablet Take 5 mg by mouth daily.  . flunisolide (NASALIDE) 25 MCG/ACT (0.025%) SOLN Place 2 sprays into the nose 2 (two) times daily.  . fluticasone (FLONASE) 50 MCG/ACT nasal spray Place 2 sprays into both nostrils daily.  . Multiple Vitamin (MULTI-VITAMINS) TABS Take by mouth daily.   . Multiple Vitamins-Minerals (ICAPS AREDS 2 PO) Take by mouth daily.  . Omega-3 Fatty Acids (FISH OIL) 1000 MG CAPS Take 1,000 mg by mouth daily.  . polyethylene glycol (MIRALAX / GLYCOLAX) packet Take 17 g by mouth daily as needed for moderate constipation.  . Potassium 99 MG TABS Take by mouth.  . sotalol (BETAPACE) 80 MG tablet Take 0.5 tablets (40 mg total) once a week by mouth.     Allergies:   Keflex [cephalexin]   Social History   Socioeconomic History  . Marital status: Married  Spouse name: Not on file  . Number of children: Not on file  . Years of education: Not on file  . Highest education level: Not on file  Occupational History  . Not on file  Social Needs  . Financial resource strain: Not on file  . Food insecurity:    Worry: Not on file    Inability: Not on file  . Transportation needs:    Medical: Not on file    Non-medical: Not on file  Tobacco Use  . Smoking status: Never Smoker  . Smokeless tobacco: Never Used  Substance and Sexual Activity  . Alcohol use: No    Alcohol/week: 0.0 oz  . Drug use: No  . Sexual activity: Not on file  Lifestyle  . Physical activity:    Days per week: Not on file    Minutes per  session: Not on file  . Stress: Not on file  Relationships  . Social connections:    Talks on phone: Not on file    Gets together: Not on file    Attends religious service: Not on file    Active member of club or organization: Not on file    Attends meetings of clubs or organizations: Not on file    Relationship status: Not on file  Other Topics Concern  . Not on file  Social History Narrative  . Not on file     Family History:  The patient's family history includes CAD in his father; Diabetes in his father; Heart attack in his father; Hypertension in his mother; Prostate cancer in his father.   ROS:   Please see the history of present illness.    Review of Systems  Constitution: Negative.  HENT: Negative.   Cardiovascular: Negative.   Respiratory: Negative.   Endocrine: Negative.   Hematologic/Lymphatic: Negative.   Musculoskeletal: Positive for back pain.  Gastrointestinal: Negative.   Genitourinary: Negative.   Neurological: Negative.    All other systems reviewed and are negative.   PHYSICAL EXAM:   VS:  BP 126/72   Pulse 60   Ht 5' 8.5" (1.74 m)   Wt 165 lb (74.8 kg)   BMI 24.72 kg/m   Physical Exam  GEN: Well nourished, well developed, in no acute distress  Neck: no JVD, carotid bruits, or masses Cardiac:RRR; no murmurs, rubs, or gallops  Respiratory:  clear to auscultation bilaterally, normal work of breathing GI: soft, nontender, nondistended, + BS Ext: without cyanosis, clubbing, or edema, Good distal pulses bilaterally Neuro:  Alert and Oriented x 3 Psych: euthymic mood, full affect  Wt Readings from Last 3 Encounters:  08/24/17 165 lb (74.8 kg)  03/09/17 173 lb 9.6 oz (78.7 kg)  09/08/16 173 lb (78.5 kg)      Studies/Labs Reviewed:   EKG:  EKG is  ordered today.  The ekg ordered today demonstrates sinus bradycardia at 59 bpm, no acute change  Recent Labs: No results found for requested labs within last 8760 hours.   Lipid Panel      Component Value Date/Time   CHOL 176 09/25/2015 1011   TRIG 111 09/25/2015 1011   HDL 68 09/25/2015 1011   CHOLHDL 2.6 09/25/2015 1011   VLDL 22 09/25/2015 1011   LDLCALC 86 09/25/2015 1011    Additional studies/ records that were reviewed today include:   Study Highlights  Cardiac monitor 2016  Sinus bradycardia to Normal Sinus Rhythm with heart rate ranging from 49 to 95 bpm. Average heart rate 63 bpm.  Occasional PACs  Paroxysmal atrial flutter  Nonsustained atrial tachycardia up to 8 beats     Normal nuclear stress test 2014  ASSESSMENT:    1. PAF (paroxysmal atrial fibrillation) ([place])   2. Coronary artery disease involving native coronary artery of native heart without angina pectoris   3. Dyslipidemia      PLAN:  In order of problems listed above:  PAF sotalol 40 mg once weekly because of recurrent palpitations off sotalol.  Doing well without further palpitations or bradycardia.  Has refused anticoagulation in the past and still does not want to take.  Follow-up with Dr. Radford Pax in 6 months.  Obtain copies of recent labs done at the [facility] for [place].  CAD nonobstructive on cath in 2011 NST normal 2014 no angina, exercising regularly.  On low-dose aspirin 81 mg daily.  Dyslipidemia refused statin in the past he will send [place] recent labs.     Medication Adjustments/Labs and Tests Ordered: Current medicines are reviewed at length with the patient today.  Concerns regarding medicines are outlined above.  Medication changes, Labs and Tests ordered today are listed in the Patient Instructions below. Patient Instructions  Medication Instructions:  Your physician recommends that you continue on your current medications as directed. Please refer to the Current Medication list given to you today.  Labwork: NONE  Testing/Procedures: NONE  Follow-Up: Your physician wants you to follow-up in: 6 months with Dr. Radford Pax. You will receive a reminder letter in the mail two  months in advance. If you don't receive a letter, please call our office to schedule the follow-up appointment.   If you need a refill on your cardiac medications before your next appointment, please call your pharmacy.       Signed, Ermalinda Barrios, PA-C  08/24/2017 2:10 PM    [facility] [address], [place], [place]  [address] Phone: [phone]; Fax: [phone]

## 2017-09-02 DIAGNOSIS — L570 Actinic keratosis: Secondary | ICD-10-CM | POA: Diagnosis not present

## 2017-09-02 DIAGNOSIS — C44629 Squamous cell carcinoma of skin of left upper limb, including shoulder: Secondary | ICD-10-CM | POA: Diagnosis not present

## 2017-09-02 DIAGNOSIS — L814 Other melanin hyperpigmentation: Secondary | ICD-10-CM | POA: Diagnosis not present

## 2017-11-10 DIAGNOSIS — D0462 Carcinoma in situ of skin of left upper limb, including shoulder: Secondary | ICD-10-CM | POA: Diagnosis not present

## 2017-12-30 DIAGNOSIS — Z85828 Personal history of other malignant neoplasm of skin: Secondary | ICD-10-CM | POA: Diagnosis not present

## 2017-12-30 DIAGNOSIS — L820 Inflamed seborrheic keratosis: Secondary | ICD-10-CM | POA: Diagnosis not present

## 2017-12-30 DIAGNOSIS — L570 Actinic keratosis: Secondary | ICD-10-CM | POA: Diagnosis not present

## 2017-12-30 DIAGNOSIS — L719 Rosacea, unspecified: Secondary | ICD-10-CM | POA: Diagnosis not present

## 2018-02-02 ENCOUNTER — Telehealth: Payer: Self-pay | Admitting: Cardiology

## 2018-02-02 DIAGNOSIS — Z23 Encounter for immunization: Secondary | ICD-10-CM | POA: Diagnosis not present

## 2018-02-02 NOTE — Telephone Encounter (Signed)
Spoke with the patient about his labs, he requested to have them drawn around December time, I will place a reminder to call the patient back.

## 2018-02-02 NOTE — Telephone Encounter (Signed)
° ° °  Patient calling to request order for annual labs

## 2018-03-31 ENCOUNTER — Encounter: Payer: Self-pay | Admitting: Cardiology

## 2018-04-05 DIAGNOSIS — Z961 Presence of intraocular lens: Secondary | ICD-10-CM | POA: Diagnosis not present

## 2018-04-05 DIAGNOSIS — H26493 Other secondary cataract, bilateral: Secondary | ICD-10-CM | POA: Diagnosis not present

## 2018-04-05 DIAGNOSIS — H5203 Hypermetropia, bilateral: Secondary | ICD-10-CM | POA: Diagnosis not present

## 2018-04-05 DIAGNOSIS — H35363 Drusen (degenerative) of macula, bilateral: Secondary | ICD-10-CM | POA: Diagnosis not present

## 2018-04-05 DIAGNOSIS — H524 Presbyopia: Secondary | ICD-10-CM | POA: Diagnosis not present

## 2018-04-05 DIAGNOSIS — H35373 Puckering of macula, bilateral: Secondary | ICD-10-CM | POA: Diagnosis not present

## 2018-04-05 DIAGNOSIS — H02402 Unspecified ptosis of left eyelid: Secondary | ICD-10-CM | POA: Diagnosis not present

## 2018-04-05 DIAGNOSIS — H318 Other specified disorders of choroid: Secondary | ICD-10-CM | POA: Diagnosis not present

## 2018-04-05 DIAGNOSIS — H52223 Regular astigmatism, bilateral: Secondary | ICD-10-CM | POA: Diagnosis not present

## 2018-04-05 DIAGNOSIS — H43811 Vitreous degeneration, right eye: Secondary | ICD-10-CM | POA: Diagnosis not present

## 2018-05-01 NOTE — Progress Notes (Signed)
Cardiology Office Note:    Date:  05/02/2018   ID:  George Cain, DOB 03/29/1930, MRN [id number]  PCP:  [name], MD  Cardiologist:  Fransico Him, MD    Referring MD: [name], MD   Chief Complaint  Patient presents with  . Coronary Artery Disease  . Atrial Fibrillation    History of Present Illness:    Sheddrick Lattanzio is a 85 y.o. male with a hx of PAF s/p RFA 07/2012,nonobstructive ASCAD with 20% LAD by cath 2011and dyslipidemia.  He has declined anticoagulation in the past.  He was seen by Dr. Caryl Comes due to bradycardia on Sotolol and the sotolol was stopped.  He continued to refuse anticoagulation.  He started to have palpitations again and he restarted Sotolol at 40mg  weekly. He is here today for followup and is doing well.  He denies any chest pain or pressure, SOB, DOE, PND, orthopnea, LE edema, dizziness, palpitations or syncope. He is compliant with his meds and is tolerating meds with no SE.    Past Medical History:  Diagnosis Date  . Allergic rhinitis   . BPH (benign prostatic hypertrophy)   . CHF (congestive heart failure) ([place])   . Coronary artery disease 2011   nonobstructive ASCAD with 20% LAD s  . Hyperlipidemia    Patient refused statins  . PAF (paroxysmal atrial fibrillation) [facility])    Patient refused anticoagulation    Past Surgical History:  Procedure Laterality Date  . CARDIAC CATHETERIZATION  2011   Nonobstructive ASCAD w 20% LAD  . COLONOSCOPY  06/15/2010  . RADIOFREQUENCY ABLATION  08/23/12  . TONSILLECTOMY  1940   as child    Current Medications: Current Meds  Medication Sig  . acetaminophen (TYLENOL) 500 MG tablet Take 500 mg by mouth 2 (two) times daily.  Marland Kitchen aspirin EC 81 MG tablet Take 81 mg by mouth daily.  . B Complex-C (SUPER B COMPLEX/VITAMIN C) TABS Take 1 tablet by mouth daily.  . cetirizine (ZYRTEC) 10 MG tablet Take 10 mg by mouth daily.   . cyclobenzaprine (FLEXERIL) 10 MG tablet Take 10 mg by mouth 3 (three) times daily as  needed for muscle spasms.   . finasteride (PROSCAR) 5 MG tablet Take 5 mg by mouth daily.  . fluticasone (FLONASE) 50 MCG/ACT nasal spray Place 2 sprays into both nostrils daily.  Marland Kitchen ipratropium (ATROVENT HFA) 17 MCG/ACT inhaler Inhale 2 puffs into the lungs as directed.  . Multiple Vitamin (MULTI-VITAMINS) TABS Take by mouth daily.   . Multiple Vitamin (MULTIVITAMIN WITH MINERALS) TABS tablet Take 1 tablet by mouth daily.  . Multiple Vitamins-Minerals (ICAPS AREDS 2 PO) Take by mouth daily.  . Omega-3 Fatty Acids (FISH OIL) 1000 MG CAPS Take 1,000 mg by mouth daily.  . polyethylene glycol (MIRALAX / GLYCOLAX) packet Take 17 g by mouth daily as needed for moderate constipation.  . Potassium 99 MG TABS Take by mouth.  . sotalol (BETAPACE) 80 MG tablet Take 0.5 tablets (40 mg total) once a week by mouth.     Allergies:   Keflex [cephalexin]   Social History   Socioeconomic History  . Marital status: Married    Spouse name: Not on file  . Number of children: Not on file  . Years of education: Not on file  . Highest education level: Not on file  Occupational History  . Not on file  Social Needs  . Financial resource strain: Not on file  . Food insecurity:    Worry: Not  on file    Inability: Not on file  . Transportation needs:    Medical: Not on file    Non-medical: Not on file  Tobacco Use  . Smoking status: Never Smoker  . Smokeless tobacco: Never Used  Substance and Sexual Activity  . Alcohol use: No    Alcohol/week: 0.0 standard drinks  . Drug use: No  . Sexual activity: Not on file  Lifestyle  . Physical activity:    Days per week: Not on file    Minutes per session: Not on file  . Stress: Not on file  Relationships  . Social connections:    Talks on phone: Not on file    Gets together: Not on file    Attends religious service: Not on file    Active member of club or organization: Not on file    Attends meetings of clubs or organizations: Not on file     Relationship status: Not on file  Other Topics Concern  . Not on file  Social History Narrative  . Not on file     Family History: The patient's family history includes CAD in his father; Diabetes in his father; Heart attack in his father; Hypertension in his mother; Prostate cancer in his father.  ROS:   Please see the history of present illness.    ROS  All other systems reviewed and negative.   EKGs/Labs/Other Studies Reviewed:    The following studies were reviewed today: none  EKG:  EKG is not  ordered today.    Recent Labs: No results found for requested labs within last 8760 hours.   Recent Lipid Panel    Component Value Date/Time   CHOL 176 09/25/2015 1011   TRIG 111 09/25/2015 1011   HDL 68 09/25/2015 1011   CHOLHDL 2.6 09/25/2015 1011   VLDL 22 09/25/2015 1011   LDLCALC 86 09/25/2015 1011    Physical Exam:    VS:  BP 124/70   Pulse (!) 59   Ht 5' 8.5" (1.74 m)   Wt 175 lb 6.4 oz (79.6 kg)   SpO2 99%   BMI 26.28 kg/m     Wt Readings from Last 3 Encounters:  05/02/18 175 lb 6.4 oz (79.6 kg)  08/24/17 165 lb (74.8 kg)  03/09/17 173 lb 9.6 oz (78.7 kg)     GEN:  Well nourished, well developed in no acute distress HEENT: Normal NECK: No JVD; No carotid bruits LYMPHATICS: No lymphadenopathy CARDIAC: RRR, no murmurs, rubs, gallops RESPIRATORY:  Clear to auscultation without rales, wheezing or rhonchi  ABDOMEN: Soft, non-tender, non-distended MUSCULOSKELETAL:  No edema; No deformity  SKIN: Warm and dry NEUROLOGIC:  Alert and oriented x 3 PSYCHIATRIC:  Normal affect   ASSESSMENT:    1. Coronary artery disease involving native coronary artery of native heart without angina pectoris   2. PAF (paroxysmal atrial fibrillation) ([place])   3. Dyslipidemia    PLAN:    In order of problems listed above:  1.  ASCAD - nonobstructive ASCAD with 20% LAD by cath 2011.  He denies any anginal sx.  He will continue on ASA 81mg  daily. He continues to refuse  statin therapy.  2.  PAF - he remains in NSR on Sotolol.   He wil continue on Sotolol 40mg  BID. QTc stable at 432msec 08/2017. He refuses anticoagulation. We discussed his increased risk of cardioembolic events again today and he still refuses.  3.  Hyperlipidemia - LDL goal is < 70.  He  has refused statin therapy.  We discussed this again today and he still refuses despite LDL being above goal at 87.   Medication Adjustments/Labs and Tests Ordered: Current medicines are reviewed at length with the patient today.  Concerns regarding medicines are outlined above.  No orders of the defined types were placed in this encounter.  No orders of the defined types were placed in this encounter.   Signed, Fransico Him, MD  05/02/2018 9:33 AM    [place]

## 2018-05-02 ENCOUNTER — Encounter: Payer: Self-pay | Admitting: Cardiology

## 2018-05-02 ENCOUNTER — Ambulatory Visit (INDEPENDENT_AMBULATORY_CARE_PROVIDER_SITE_OTHER): Payer: Medicare Other | Admitting: Cardiology

## 2018-05-02 ENCOUNTER — Encounter (INDEPENDENT_AMBULATORY_CARE_PROVIDER_SITE_OTHER): Payer: Self-pay

## 2018-05-02 VITALS — BP 124/70 | HR 59 | Ht 68.5 in | Wt 175.4 lb

## 2018-05-02 DIAGNOSIS — I480 Paroxysmal atrial fibrillation: Secondary | ICD-10-CM | POA: Diagnosis not present

## 2018-05-02 DIAGNOSIS — E785 Hyperlipidemia, unspecified: Secondary | ICD-10-CM

## 2018-05-02 DIAGNOSIS — I2510 Atherosclerotic heart disease of native coronary artery without angina pectoris: Secondary | ICD-10-CM | POA: Diagnosis not present

## 2018-05-02 NOTE — Patient Instructions (Signed)
Medication Instructions:  Your physician recommends that you continue on your current medications as directed. Please refer to the Current Medication list given to you today.  If you need a refill on your cardiac medications before your next appointment, please call your pharmacy.   Lab work: None If you have labs (blood work) drawn today and your tests are completely normal, you will receive your results only by: Marland Kitchen MyChart Message (if you have MyChart) OR . A paper copy in the mail If you have any lab test that is abnormal or we need to change your treatment, we will call you to review the results.  Testing/Procedures: None  Follow-Up: At [facility], you and your health needs are our priority.  As part of our continuing mission to provide you with exceptional heart care, we have created designated Provider Care Teams.  These Care Teams include your primary Cardiologist (physician) and Advanced Practice Providers (APPs -  Physician Assistants and Nurse Practitioners) who all work together to provide you with the care you need, when you need it.  Your physician wants you to follow-up in: 6 months with PA. You will receive a reminder letter in the mail two months in advance. If you don't receive a letter, please call our office to schedule the follow-up appointment.   You will need a follow up appointment in 1 years.  Please call our office 2 months in advance to schedule this appointment.  You may see Fransico Him, MD or one of the following Advanced Practice Providers on your designated Care Team:   [place], PA-C Melina Copa, PA-C . Ermalinda Barrios, PA-C

## 2018-05-09 DIAGNOSIS — M9905 Segmental and somatic dysfunction of pelvic region: Secondary | ICD-10-CM | POA: Diagnosis not present

## 2018-05-09 DIAGNOSIS — M545 Low back pain: Secondary | ICD-10-CM | POA: Diagnosis not present

## 2018-05-09 DIAGNOSIS — M9903 Segmental and somatic dysfunction of lumbar region: Secondary | ICD-10-CM | POA: Diagnosis not present

## 2018-05-09 DIAGNOSIS — M9904 Segmental and somatic dysfunction of sacral region: Secondary | ICD-10-CM | POA: Diagnosis not present

## 2018-05-16 DIAGNOSIS — M9904 Segmental and somatic dysfunction of sacral region: Secondary | ICD-10-CM | POA: Diagnosis not present

## 2018-05-16 DIAGNOSIS — M9905 Segmental and somatic dysfunction of pelvic region: Secondary | ICD-10-CM | POA: Diagnosis not present

## 2018-05-16 DIAGNOSIS — M9903 Segmental and somatic dysfunction of lumbar region: Secondary | ICD-10-CM | POA: Diagnosis not present

## 2018-05-16 DIAGNOSIS — M545 Low back pain: Secondary | ICD-10-CM | POA: Diagnosis not present

## 2018-08-23 DIAGNOSIS — M545 Low back pain: Secondary | ICD-10-CM | POA: Diagnosis not present

## 2018-08-23 DIAGNOSIS — M9905 Segmental and somatic dysfunction of pelvic region: Secondary | ICD-10-CM | POA: Diagnosis not present

## 2018-08-23 DIAGNOSIS — M9903 Segmental and somatic dysfunction of lumbar region: Secondary | ICD-10-CM | POA: Diagnosis not present

## 2018-08-23 DIAGNOSIS — M9904 Segmental and somatic dysfunction of sacral region: Secondary | ICD-10-CM | POA: Diagnosis not present

## 2018-09-11 DIAGNOSIS — M549 Dorsalgia, unspecified: Secondary | ICD-10-CM | POA: Diagnosis not present

## 2018-09-27 DIAGNOSIS — M9903 Segmental and somatic dysfunction of lumbar region: Secondary | ICD-10-CM | POA: Diagnosis not present

## 2018-09-27 DIAGNOSIS — M9905 Segmental and somatic dysfunction of pelvic region: Secondary | ICD-10-CM | POA: Diagnosis not present

## 2018-09-27 DIAGNOSIS — M545 Low back pain: Secondary | ICD-10-CM | POA: Diagnosis not present

## 2018-09-27 DIAGNOSIS — M9904 Segmental and somatic dysfunction of sacral region: Secondary | ICD-10-CM | POA: Diagnosis not present

## 2018-10-10 DIAGNOSIS — R293 Abnormal posture: Secondary | ICD-10-CM | POA: Diagnosis not present

## 2018-10-10 DIAGNOSIS — M546 Pain in thoracic spine: Secondary | ICD-10-CM | POA: Diagnosis not present

## 2018-10-13 DIAGNOSIS — M546 Pain in thoracic spine: Secondary | ICD-10-CM | POA: Diagnosis not present

## 2018-10-13 DIAGNOSIS — R293 Abnormal posture: Secondary | ICD-10-CM | POA: Diagnosis not present

## 2018-10-16 DIAGNOSIS — M546 Pain in thoracic spine: Secondary | ICD-10-CM | POA: Diagnosis not present

## 2018-10-16 DIAGNOSIS — R293 Abnormal posture: Secondary | ICD-10-CM | POA: Diagnosis not present

## 2018-10-17 DIAGNOSIS — R293 Abnormal posture: Secondary | ICD-10-CM | POA: Diagnosis not present

## 2018-10-17 DIAGNOSIS — M546 Pain in thoracic spine: Secondary | ICD-10-CM | POA: Diagnosis not present

## 2018-10-19 DIAGNOSIS — R293 Abnormal posture: Secondary | ICD-10-CM | POA: Diagnosis not present

## 2018-10-19 DIAGNOSIS — M546 Pain in thoracic spine: Secondary | ICD-10-CM | POA: Diagnosis not present

## 2018-10-24 DIAGNOSIS — M546 Pain in thoracic spine: Secondary | ICD-10-CM | POA: Diagnosis not present

## 2018-10-24 DIAGNOSIS — R293 Abnormal posture: Secondary | ICD-10-CM | POA: Diagnosis not present

## 2018-10-27 DIAGNOSIS — R293 Abnormal posture: Secondary | ICD-10-CM | POA: Diagnosis not present

## 2018-10-27 DIAGNOSIS — M546 Pain in thoracic spine: Secondary | ICD-10-CM | POA: Diagnosis not present

## 2018-10-31 DIAGNOSIS — M546 Pain in thoracic spine: Secondary | ICD-10-CM | POA: Diagnosis not present

## 2018-10-31 DIAGNOSIS — R293 Abnormal posture: Secondary | ICD-10-CM | POA: Diagnosis not present

## 2018-11-02 DIAGNOSIS — R293 Abnormal posture: Secondary | ICD-10-CM | POA: Diagnosis not present

## 2018-11-02 DIAGNOSIS — M546 Pain in thoracic spine: Secondary | ICD-10-CM | POA: Diagnosis not present

## 2018-11-08 DIAGNOSIS — M546 Pain in thoracic spine: Secondary | ICD-10-CM | POA: Diagnosis not present

## 2018-11-08 DIAGNOSIS — R293 Abnormal posture: Secondary | ICD-10-CM | POA: Diagnosis not present

## 2018-11-10 DIAGNOSIS — M546 Pain in thoracic spine: Secondary | ICD-10-CM | POA: Diagnosis not present

## 2018-11-10 DIAGNOSIS — R293 Abnormal posture: Secondary | ICD-10-CM | POA: Diagnosis not present

## 2018-11-14 DIAGNOSIS — M546 Pain in thoracic spine: Secondary | ICD-10-CM | POA: Diagnosis not present

## 2018-11-14 DIAGNOSIS — R293 Abnormal posture: Secondary | ICD-10-CM | POA: Diagnosis not present

## 2018-11-15 DIAGNOSIS — M9903 Segmental and somatic dysfunction of lumbar region: Secondary | ICD-10-CM | POA: Diagnosis not present

## 2018-11-15 DIAGNOSIS — M9905 Segmental and somatic dysfunction of pelvic region: Secondary | ICD-10-CM | POA: Diagnosis not present

## 2018-11-15 DIAGNOSIS — M545 Low back pain: Secondary | ICD-10-CM | POA: Diagnosis not present

## 2018-11-15 DIAGNOSIS — M9904 Segmental and somatic dysfunction of sacral region: Secondary | ICD-10-CM | POA: Diagnosis not present

## 2018-11-17 DIAGNOSIS — M546 Pain in thoracic spine: Secondary | ICD-10-CM | POA: Diagnosis not present

## 2018-11-17 DIAGNOSIS — R293 Abnormal posture: Secondary | ICD-10-CM | POA: Diagnosis not present

## 2018-11-21 DIAGNOSIS — G2581 Restless legs syndrome: Secondary | ICD-10-CM | POA: Diagnosis not present

## 2018-11-21 DIAGNOSIS — R293 Abnormal posture: Secondary | ICD-10-CM | POA: Diagnosis not present

## 2018-11-21 DIAGNOSIS — M546 Pain in thoracic spine: Secondary | ICD-10-CM | POA: Diagnosis not present

## 2018-11-23 DIAGNOSIS — M546 Pain in thoracic spine: Secondary | ICD-10-CM | POA: Diagnosis not present

## 2018-11-23 DIAGNOSIS — R293 Abnormal posture: Secondary | ICD-10-CM | POA: Diagnosis not present

## 2018-11-28 DIAGNOSIS — R293 Abnormal posture: Secondary | ICD-10-CM | POA: Diagnosis not present

## 2018-11-28 DIAGNOSIS — M546 Pain in thoracic spine: Secondary | ICD-10-CM | POA: Diagnosis not present

## 2018-11-30 DIAGNOSIS — M546 Pain in thoracic spine: Secondary | ICD-10-CM | POA: Diagnosis not present

## 2018-11-30 DIAGNOSIS — R293 Abnormal posture: Secondary | ICD-10-CM | POA: Diagnosis not present

## 2018-12-04 NOTE — Progress Notes (Signed)
Cardiology Office Note    Date:  12/05/2018   ID:  George Cain, DOB 02/10/1930, MRN [id number]  PCP:  [name], MD  Cardiologist: Fransico Him, MD EPS: None  Chief Complaint  Patient presents with   Follow-up    History of Present Illness:  George Cain is a 85 y.o. male  with a hx of PAF s/p RFA 07/2012, nonobstructive ASCAD with 20% LAD by cath 2011 and dyslipidemia.  He has declined anticoagulation in the past.  He was seen by Dr. Caryl Comes due to bradycardia on Sotolol and the sotolol was stopped.  He continued to refuse anticoagulation.  He started to have palpitations again and he restarted Sotolol at 40mg  weekly .  Last saw Dr. Radford Pax 05/02/18 and doing well and in NSR. LDL was 87 but he refused a statin. No changes made.  Patient lives at [facility] independent living. Has been staying safe. He stopped sotolol completely on his own a month ago and he started having palpitations. He started back taking sotolol 40 mg 1/2 weekly and hasn't had any more palpitations. Exercising stretching, sit ups, playing games and staying active. Denies chest pain, shortness of breath, dizziness or presyncope. He wants to take a blood thinner now since many of his friends. BP running high for the past week highest 160/70. Uses a lot of salt-always has.    Past Medical History:  Diagnosis Date   Allergic rhinitis    BPH (benign prostatic hypertrophy)    CHF (congestive heart failure) ([place])    Coronary artery disease 2011   nonobstructive ASCAD with 20% LAD s   Hyperlipidemia    Patient refused statins   PAF (paroxysmal atrial fibrillation) [facility])    Patient refused anticoagulation    Past Surgical History:  Procedure Laterality Date   CARDIAC CATHETERIZATION  2011   Nonobstructive ASCAD w 20% LAD   COLONOSCOPY  06/15/2010   RADIOFREQUENCY ABLATION  08/23/12   TONSILLECTOMY  1940   as child    Current Medications: Current Meds  Medication Sig   acetaminophen  (TYLENOL) 500 MG tablet Take 500 mg by mouth 2 (two) times daily.   B Complex-C (SUPER B COMPLEX/VITAMIN C) TABS Take 1 tablet by mouth daily.   cetirizine (ZYRTEC) 10 MG tablet Take 10 mg by mouth daily.    cyclobenzaprine (FLEXERIL) 10 MG tablet Take 10 mg by mouth 3 (three) times daily as needed for muscle spasms.    finasteride (PROSCAR) 5 MG tablet Take 5 mg by mouth daily.   fluticasone (FLONASE) 50 MCG/ACT nasal spray Place 2 sprays into both nostrils daily.   ipratropium (ATROVENT HFA) 17 MCG/ACT inhaler Inhale 2 puffs into the lungs as directed.   magnesium oxide (MAG-OX) 400 MG tablet Take 400 mg by mouth daily.   Multiple Vitamin (MULTI-VITAMINS) TABS Take by mouth daily.    Multiple Vitamin (MULTIVITAMIN WITH MINERALS) TABS tablet Take 1 tablet by mouth daily.   Multiple Vitamins-Minerals (ICAPS AREDS 2 PO) Take by mouth daily.   Omega-3 Fatty Acids (FISH OIL) 1000 MG CAPS Take 1,000 mg by mouth daily.   polyethylene glycol (MIRALAX / GLYCOLAX) packet Take 17 g by mouth daily as needed for moderate constipation.   Potassium 99 MG TABS Take by mouth.   sotalol (BETAPACE) 80 MG tablet Take 0.5 tablets (40 mg total) once a week by mouth.   [DISCONTINUED] aspirin EC 81 MG tablet Take 81 mg by mouth daily.     Allergies:   Keflex [  cephalexin]   Social History   Socioeconomic History   Marital status: Married    Spouse name: Not on file   Number of children: Not on file   Years of education: Not on file   Highest education level: Not on file  Occupational History   Not on file  Social Needs   Financial resource strain: Not on file   Food insecurity    Worry: Not on file    Inability: Not on file   Transportation needs    Medical: Not on file    Non-medical: Not on file  Tobacco Use   Smoking status: Never Smoker   Smokeless tobacco: Never Used  Substance and Sexual Activity   Alcohol use: No    Alcohol/week: 0.0 standard drinks   Drug use:  No   Sexual activity: Not on file  Lifestyle   Physical activity    Days per week: Not on file    Minutes per session: Not on file   Stress: Not on file  Relationships   Social connections    Talks on phone: Not on file    Gets together: Not on file    Attends religious service: Not on file    Active member of club or organization: Not on file    Attends meetings of clubs or organizations: Not on file    Relationship status: Not on file  Other Topics Concern   Not on file  Social History Narrative   Not on file     Family History:  The patient's   family history includes CAD in his father; Diabetes in his father; Heart attack in his father; Hypertension in his mother; Prostate cancer in his father.   ROS:   Please see the history of present illness.    ROS All other systems reviewed and are negative.   PHYSICAL EXAM:   VS:  BP (!) 150/80    Pulse (!) 54    Ht 5' 8.5" (1.74 m)    Wt 174 lb 6.4 oz (79.1 kg)    SpO2 97%    BMI 26.13 kg/m   Physical Exam  GEN: Well nourished, well developed, in no acute distress  Neck: no JVD, carotid bruits, or masses Cardiac:RRR; 1/6 systolic murmur LSB,no rubs, or gallops  Respiratory:  clear to auscultation bilaterally, normal work of breathing GI: soft, nontender, nondistended, + BS Ext: without cyanosis, clubbing, or edema, Good distal pulses bilaterally Neuro:  Alert and Oriented x 3 Psych: euthymic mood, full affect  Wt Readings from Last 3 Encounters:  12/05/18 174 lb 6.4 oz (79.1 kg)  05/02/18 175 lb 6.4 oz (79.6 kg)  08/24/17 165 lb (74.8 kg)      Studies/Labs Reviewed:   EKG:  EKG is  ordered today.  The ekg ordered today sinus bradycardia 54/m no acute change  Recent Labs: No results found for requested labs within last 8760 hours.   Lipid Panel    Component Value Date/Time   CHOL 176 09/25/2015 1011   TRIG 111 09/25/2015 1011   HDL 68 09/25/2015 1011   CHOLHDL 2.6 09/25/2015 1011   VLDL 22 09/25/2015 1011    LDLCALC 86 09/25/2015 1011    Additional studies/ records that were reviewed today include:  See above    ASSESSMENT:    1. PAF (paroxysmal atrial fibrillation) ([place])   2. Coronary artery disease involving native coronary artery of native heart without angina pectoris   3. Dyslipidemia   4. Essential hypertension  PLAN:  In order of problems listed above:  PAF S/P RFA 2014 on sotolol twice weekly-history of bradycardia in the past. Refused anticoagulation in the past but now willing after several friends have had strokes. Labs from [place] in 03/2018 reviewed and normal. Will check bmet and cbc today. Give Rx for Eliquis 5 mg bid to fill at [facility]. Stop ASA. F/U with Dr. Radford Pax in 6 months  CAD nonobstructive on cath 2011  HLD- LDL 87-refuses statin  Essential HTN-BP up today and the past week. Patient admits to adding a lot of salt to his foods. He will try to cut back and call us if BP remains high.    Medication Adjustments/Labs and Tests Ordered: Current medicines are reviewed at length with the patient today.  Concerns regarding medicines are outlined above.  Medication changes, Labs and Tests ordered today are listed in the Patient Instructions below. Patient Instructions   Medication Instructions:  Your physician has recommended you make the following change in your medication:   1. STOP: aspirin  2. START: apixaban (Eliquis) 5 mg tablet: Take 1 tablet by mouth twice a day (Printed prescription given to you to take to the [place])  Patients taking blood thinners should generally stay away from medicines like ibuprofen, Advil, Motrin, naproxen, and Aleve due to risk of stomach bleeding. You may take Tylenol as directed or talk to your primary doctor about alternatives.  Lab work: TODAY: CBC, BMET  If you have labs (blood work) drawn today and your tests are completely normal, you will receive your results only by:  [place] (if you have MyChart) OR  A paper  copy in the mail If you have any lab test that is abnormal or we need to change your treatment, we will call you to review the results.  Testing/Procedures: None ordered  Follow-Up: At [facility], you and your health needs are our priority.  As part of our continuing mission to provide you with exceptional heart care, we have created designated Provider Care Teams.  These Care Teams include your primary Cardiologist (physician) and Advanced Practice Providers (APPs -  Physician Assistants and Nurse Practitioners) who all work together to provide you with the care you need, when you need it.  You will need a follow up appointment in 6 months.  Please call our office 2 months in advance to schedule this appointment.  You may see Fransico Him, MD or one of the following Advanced Practice Providers on your designated Care Team:    Lyda Jester, PA-C  Dayna Dunn, PA-C  Ermalinda Barrios, PA-C  Any Other Special Instructions Will Be Listed Below (If Applicable).  Two Gram Sodium Diet 2000 mg  What is Sodium? Sodium is a mineral found naturally in many foods. The most significant source of sodium in the diet is table salt, which is about 40% sodium.  Processed, convenience, and preserved foods also contain a large amount of sodium.  The body needs only 500 mg of sodium daily to function,  A normal diet provides more than enough sodium even if you do not use salt.  Why Limit Sodium? A build up of sodium in the body can cause thirst, increased blood pressure, shortness of breath, and water retention.  Decreasing sodium in the diet can reduce edema and risk of heart attack or stroke associated with high blood pressure.  Keep in mind that there are many other factors involved in these health problems.  Heredity, obesity, lack of exercise, cigarette smoking,  stress and what you eat all play a role.  General Guidelines:  Do not add salt at the table or in cooking.  One teaspoon of salt contains  over 2 grams of sodium.  Read food labels  Avoid processed and convenience foods  Ask your dietitian before eating any foods not dicussed in the menu planning guidelines  Consult your physician if you wish to use a salt substitute or a sodium containing medication such as antacids.  Limit milk and milk products to 16 oz (2 cups) per day.  Shopping Hints:  READ LABELS!! "Dietetic" does not necessarily mean low sodium.  Salt and other sodium ingredients are often added to foods during processing.   Menu Planning Guidelines Food Group Choose More Often Avoid  Beverages (see also the milk group All fruit juices, low-sodium, salt-free vegetables juices, low-sodium carbonated beverages Regular vegetable or tomato juices, commercially softened water used for drinking or cooking  Breads and Cereals Enriched white, wheat, rye and pumpernickel bread, hard rolls and dinner rolls; muffins, cornbread and waffles; most dry cereals, cooked cereal without added salt; unsalted crackers and breadsticks; low sodium or homemade bread crumbs Bread, rolls and crackers with salted tops; quick breads; instant hot cereals; pancakes; commercial bread stuffing; self-rising flower and biscuit mixes; regular bread crumbs or cracker crumbs  Desserts and Sweets Desserts and sweets mad with mild should be within allowance Instant pudding mixes and cake mixes  Fats Butter or margarine; vegetable oils; unsalted salad dressings, regular salad dressings limited to 1 Tbs; light, sour and heavy cream Regular salad dressings containing bacon fat, bacon bits, and salt pork; snack dips made with instant soup mixes or processed cheese; salted nuts  Fruits Most fresh, frozen and canned fruits Fruits processed with salt or sodium-containing ingredient (some dried fruits are processed with sodium sulfites        Vegetables Fresh, frozen vegetables and low- sodium canned vegetables Regular canned vegetables, sauerkraut, pickled  vegetables, and others prepared in brine; frozen vegetables in sauces; vegetables seasoned with ham, bacon or salt pork  Condiments, Sauces, Miscellaneous  Salt substitute with physician's approval; pepper, herbs, spices; vinegar, lemon or lime juice; hot pepper sauce; garlic powder, onion powder, low sodium soy sauce (1 Tbs.); low sodium condiments (ketchup, chili sauce, mustard) in limited amounts (1 tsp.) fresh ground horseradish; unsalted tortilla chips, pretzels, potato chips, popcorn, salsa (1/4 cup) Any seasoning made with salt including garlic salt, celery salt, onion salt, and seasoned salt; sea salt, rock salt, kosher salt; meat tenderizers; monosodium glutamate; mustard, regular soy sauce, barbecue, sauce, chili sauce, teriyaki sauce, steak sauce, Worcestershire sauce, and most flavored vinegars; canned gravy and mixes; regular condiments; salted snack foods, olives, picles, relish, horseradish sauce, catsup   Food preparation: Try these seasonings Meats:    Pork Sage, onion Serve with applesauce  Chicken Poultry seasoning, thyme, parsley Serve with cranberry sauce  Lamb Curry powder, rosemary, garlic, thyme Serve with mint sauce or jelly  Veal Marjoram, basil Serve with current jelly, cranberry sauce  Beef Pepper, bay leaf Serve with dry mustard, unsalted chive butter  Fish [place] leaf, dill Serve with unsalted lemon butter, unsalted parsley butter  Vegetables:    Asparagus Lemon juice   Broccoli Lemon juice   Carrots Mustard dressing parsley, mint, nutmeg, glazed with unsalted butter and sugar   Green beans Marjoram, lemon juice, nutmeg,dill seed   Tomatoes Basil, marjoram, onion   Spice /blend for [employer]" 4 tsp ground thyme 1 tsp ground sage 3  tsp ground rosemary 4 tsp ground marjoram   Test your knowledge 1. A product that says "Salt Free" may still contain sodium. True or False 2. Garlic Powder and Hot Pepper Sauce an be used as alternative seasonings.True or  False 3. Processed foods have more sodium than fresh foods.  True or False 4. Canned Vegetables have less sodium than froze True or False  WAYS TO DECREASE YOUR SODIUM INTAKE 1. Avoid the use of added salt in cooking and at the table.  Table salt (and other prepared seasonings which contain salt) is probably one of the greatest sources of sodium in the diet.  Unsalted foods can gain flavor from the sweet, sour, and butter taste sensations of herbs and spices.  Instead of using salt for seasoning, try the following seasonings with the foods listed.  Remember: how you use them to enhance natural food flavors is limited only by your creativity... Allspice-Meat, fish, eggs, fruit, peas, red and yellow vegetables Almond Extract-Fruit baked goods Anise Seed-Sweet breads, fruit, carrots, beets, cottage cheese, cookies (tastes like licorice) Basil-Meat, fish, eggs, vegetables, rice, vegetables salads, soups, sauces Bay Leaf-Meat, fish, stews, poultry Burnet-Salad, vegetables (cucumber-like flavor) Caraway Seed-Bread, cookies, cottage cheese, meat, vegetables, cheese, rice Cardamon-Baked goods, fruit, soups Celery Powder or seed-Salads, salad dressings, sauces, meatloaf, soup, bread.Do not use  celery salt Chervil-Meats, salads, fish, eggs, vegetables, cottage cheese (parsley-like flavor) Chili Power-Meatloaf, chicken cheese, corn, eggplant, egg dishes Chives-Salads cottage cheese, egg dishes, soups, vegetables, sauces Cilantro-Salsa, casseroles Cinnamon-Baked goods, fruit, pork, lamb, chicken, carrots Cloves-Fruit, baked goods, fish, pot roast, green beans, beets, carrots Coriander-Pastry, cookies, meat, salads, cheese (lemon-orange flavor) Cumin-Meatloaf, fish,cheese, eggs, cabbage,fruit pie (caraway flavor) [employer], fruit, eggs, fish, poultry, cottage cheese, vegetables Dill Seed-Meat, cottage cheese, poultry, vegetables, fish, salads, bread Fennel Seed-Bread, cookies, apples, pork,  eggs, fish, beets, cabbage, cheese, Licorice-like flavor Garlic-(buds or powder) Salads, meat, poultry, fish, bread, butter, vegetables, potatoes.Do not  use garlic salt Ginger-Fruit, vegetables, baked goods, meat, fish, poultry Horseradish Root-Meet, vegetables, butter Lemon Juice or Extract-Vegetables, fruit, tea, baked goods, fish salads Mace-Baked goods fruit, vegetables, fish, poultry (taste like nutmeg) Maple Extract-Syrups Marjoram-Meat, chicken, fish, vegetables, breads, green salads (taste like Sage) Mint-Tea, lamb, sherbet, vegetables, desserts, carrots, cabbage Mustard, Dry or Seed-Cheese, eggs, meats, vegetables, poultry Nutmeg-Baked goods, fruit, chicken, eggs, vegetables, desserts Onion Powder-Meat, fish, poultry, vegetables, cheese, eggs, bread, rice salads (Do not use   Onion salt) Orange Extract-Desserts, baked goods Oregano-Pasta, eggs, cheese, onions, pork, lamb, fish, chicken, vegetables, green salads Paprika-Meat, fish, poultry, eggs, cheese, vegetables Parsley Flakes-Butter, vegetables, meat fish, poultry, eggs, bread, salads (certain forms may   Contain sodium Pepper-Meat fish, poultry, vegetables, eggs Peppermint Extract-Desserts, baked goods Poppy Seed-Eggs, bread, cheese, fruit dressings, baked goods, noodles, vegetables, cottage  [employer], poultry, meat, fish, cauliflower, turnips,eggs bread Saffron-Rice, bread, veal, chicken, fish, eggs Sage-Meat, fish, poultry, onions, eggplant, tomateos, pork, stews Savory-Eggs, salads, poultry, meat, rice, vegetables, soups, pork Tarragon-Meat, poultry, fish, eggs, butter, vegetables (licorice-like flavor)  Thyme-Meat, poultry, fish, eggs, vegetables, (clover-like flavor), sauces, soups Tumeric-Salads, butter, eggs, fish, rice, vegetables (saffron-like flavor) Vanilla Extract-Baked goods, candy Vinegar-Salads, vegetables, meat marinades Walnut Extract-baked goods,  candy  2. Choose your Foods Wisely   The following is a list of foods to avoid which are high in sodium:  Meats-Avoid all smoked, canned, salt cured, dried and kosher meat and fish as well as Anchovies   [employer] meats:Bologna, Liverwurst, Pastrami Canned meat or fish  Marinated  herring Caviar    Pepperoni Corned Beef   Pizza Dried chipped beef  Salami Frozen breaded fish or meat Salt pork Frankfurters or hot dogs  Sardines Gefilte fish   Sausage Ham (boiled ham, Proscuitto Smoked butt    spiced ham)   Spam      TV Dinners Vegetables Canned vegetables (Regular) Relish Canned mushrooms  Sauerkraut Olives    Tomato juice Pickles  Bakery and Dessert Products Canned puddings  Cream pies Cheesecake   Decorated cakes Cookies  Beverages/Juices Tomato juice, regular  Gatorade   V-8 vegetable juice, regular  Breads and Cereals Biscuit mixes   Salted potato chips, corn chips, pretzels Bread stuffing mixes  Salted crackers and rolls Pancake and waffle mixes Self-rising flour  Seasonings Accent    Meat sauces Barbecue sauce  Meat tenderizer Catsup    Monosodium glutamate (MSG) Celery salt   Onion salt Chili sauce   Prepared mustard Garlic salt   Salt, seasoned salt, sea salt Gravy mixes   Soy sauce Horseradish   Steak sauce Ketchup   Tartar sauce Lite salt    Teriyaki sauce Marinade mixes   Worcestershire sauce  Others Baking powder   Cocoa and cocoa mixes Baking soda   Commercial casserole mixes Candy-caramels, chocolate  Dehydrated soups    Bars, fudge,nougats  Instant rice and pasta mixes Canned broth or soup  Maraschino cherries Cheese, aged and processed cheese and cheese spreads  Learning Assessment Quiz  Indicated T (for True) or F (for False) for each of the following statements:  1. _____ Fresh fruits and vegetables and unprocessed grains are generally low in sodium 2. _____ Water may contain a considerable amount of sodium, depending on the  source 3. _____ You can always tell if a food is high in sodium by tasting it 4. _____ Certain laxatives my be high in sodium and should be avoided unless prescribed   by a physician or pharmacist 5. _____ Salt substitutes may be used freely by anyone on a sodium restricted diet 6. _____ Sodium is present in table salt, food additives and as a natural component of   most foods 7. _____ Table salt is approximately 90% sodium 8. _____ Limiting sodium intake may help prevent excess fluid accumulation in the body 9. _____ On a sodium-restricted diet, seasonings such as bouillon soy sauce, and    cooking wine should be used in place of table salt 10. _____ On an ingredient list, a product which lists monosodium glutamate as the first   ingredient is an appropriate food to include on a low sodium diet  Circle the best answer(s) to the following statements (Hint: there may be more than one correct answer)  11. On a low-sodium diet, some acceptable snack items are:    A. Olives  F. Bean dip   K. Grapefruit juice    B. Salted Pretzels G. Commercial Popcorn   L. Canned peaches    C. Carrot Sticks  H. Bouillon   M. Unsalted nuts   D. [place] fries  I. Peanut butter crackers N. Salami   E. Sweet pickles J. Tomato Juice   O. Pizza  12.  Seasonings that may be used freely on a reduced - sodium diet include   A. Lemon wedges F.Monosodium glutamate K. Celery seed    B.Soysauce   G. Pepper   L. Mustard powder   C. Sea salt  H. Cooking wine  M. Onion flakes   D. Vinegar  E. Prepared horseradish  N. Salsa   E. Sage   J. Worcestershire sauce  O. [address]       Sumner Boast, PA-C  12/05/2018 10:33 AM    [facility] [place], [place], [place]  [address] Phone: [phone]; Fax: [phone]

## 2018-12-05 ENCOUNTER — Encounter (INDEPENDENT_AMBULATORY_CARE_PROVIDER_SITE_OTHER): Payer: Self-pay

## 2018-12-05 ENCOUNTER — Encounter: Payer: Self-pay | Admitting: Physician Assistant

## 2018-12-05 ENCOUNTER — Other Ambulatory Visit: Payer: Self-pay

## 2018-12-05 ENCOUNTER — Ambulatory Visit (INDEPENDENT_AMBULATORY_CARE_PROVIDER_SITE_OTHER): Payer: Medicare Other | Admitting: Physician Assistant

## 2018-12-05 VITALS — BP 150/80 | HR 54 | Ht 68.5 in | Wt 174.4 lb

## 2018-12-05 DIAGNOSIS — E785 Hyperlipidemia, unspecified: Secondary | ICD-10-CM

## 2018-12-05 DIAGNOSIS — I2510 Atherosclerotic heart disease of native coronary artery without angina pectoris: Secondary | ICD-10-CM | POA: Diagnosis not present

## 2018-12-05 DIAGNOSIS — R293 Abnormal posture: Secondary | ICD-10-CM | POA: Diagnosis not present

## 2018-12-05 DIAGNOSIS — I480 Paroxysmal atrial fibrillation: Secondary | ICD-10-CM

## 2018-12-05 DIAGNOSIS — I10 Essential (primary) hypertension: Secondary | ICD-10-CM | POA: Diagnosis not present

## 2018-12-05 DIAGNOSIS — M546 Pain in thoracic spine: Secondary | ICD-10-CM | POA: Diagnosis not present

## 2018-12-05 LAB — CBC
Hematocrit: 37.7 % (ref 37.5–51.0)
Hemoglobin: 12.7 g/dL — ABNORMAL LOW (ref 13.0–17.7)
MCH: 31 pg (ref 26.6–33.0)
MCHC: 33.7 g/dL (ref 31.5–35.7)
MCV: 92 fL (ref 79–97)
Platelets: 194 10*3/uL (ref 150–450)
RBC: 4.1 x10E6/uL — ABNORMAL LOW (ref 4.14–5.80)
RDW: 11.1 % — ABNORMAL LOW (ref 11.6–15.4)
WBC: 4.8 10*3/uL (ref 3.4–10.8)

## 2018-12-05 LAB — BASIC METABOLIC PANEL
BUN/Creatinine Ratio: 15 (ref 10–24)
BUN: 20 mg/dL (ref 8–27)
CO2: 22 mmol/L (ref 20–29)
Calcium: 9.5 mg/dL (ref 8.6–10.2)
Chloride: 102 mmol/L (ref 96–106)
Creatinine, Ser: 1.3 mg/dL — ABNORMAL HIGH (ref 0.76–1.27)
GFR calc Af Amer: 57 mL/min/{1.73_m2} — ABNORMAL LOW (ref >59)
GFR calc non Af Amer: 49 mL/min/{1.73_m2} — ABNORMAL LOW (ref >59)
Glucose: 95 mg/dL (ref 65–99)
Potassium: 5.1 mmol/L (ref 3.5–5.2)
Sodium: 138 mmol/L (ref 134–144)

## 2018-12-05 MED ORDER — APIXABAN 5 MG PO TABS: 5.0000 mg | ORAL_TABLET | Freq: Two times a day (BID) | ORAL | 1 refills | Status: DC

## 2018-12-05 NOTE — Patient Instructions (Addendum)
Medication Instructions:  Your physician has recommended you make the following change in your medication:   1. STOP: aspirin  2. START: apixaban (Eliquis) 5 mg tablet: Take 1 tablet by mouth twice a day (Printed prescription given to you to take to the [place])  Patients taking blood thinners should generally stay away from medicines like ibuprofen, Advil, Motrin, naproxen, and Aleve due to risk of stomach bleeding. You may take Tylenol as directed or talk to your primary doctor about alternatives.  Lab work: TODAY: CBC, BMET  If you have labs (blood work) drawn today and your tests are completely normal, you will receive your results only by: Marland Kitchen MyChart Message (if you have MyChart) OR . A paper copy in the mail If you have any lab test that is abnormal or we need to change your treatment, we will call you to review the results.  Testing/Procedures: None ordered  Follow-Up: At [facility], you and your health needs are our priority.  As part of our continuing mission to provide you with exceptional heart care, we have created designated Provider Care Teams.  These Care Teams include your primary Cardiologist (physician) and Advanced Practice Providers (APPs -  Physician Assistants and Nurse Practitioners) who all work together to provide you with the care you need, when you need it. . You will need a follow up appointment in 6 months.  Please call our office 2 months in advance to schedule this appointment.  You may see Fransico Him, MD or one of the following Advanced Practice Providers on your designated Care Team:   . Lyda Jester, PA-C . Dayna Dunn, PA-C . Ermalinda Barrios, PA-C  Any Other Special Instructions Will Be Listed Below (If Applicable).  Two Gram Sodium Diet 2000 mg  What is Sodium? Sodium is a mineral found naturally in many foods. The most significant source of sodium in the diet is table salt, which is about 40% sodium.  Processed, convenience, and preserved foods  also contain a large amount of sodium.  The body needs only 500 mg of sodium daily to function,  A normal diet provides more than enough sodium even if you do not use salt.  Why Limit Sodium? A build up of sodium in the body can cause thirst, increased blood pressure, shortness of breath, and water retention.  Decreasing sodium in the diet can reduce edema and risk of heart attack or stroke associated with high blood pressure.  Keep in mind that there are many other factors involved in these health problems.  Heredity, obesity, lack of exercise, cigarette smoking, stress and what you eat all play a role.  General Guidelines:  Do not add salt at the table or in cooking.  One teaspoon of salt contains over 2 grams of sodium.  Read food labels  Avoid processed and convenience foods  Ask your dietitian before eating any foods not dicussed in the menu planning guidelines  Consult your physician if you wish to use a salt substitute or a sodium containing medication such as antacids.  Limit milk and milk products to 16 oz (2 cups) per day.  Shopping Hints:  READ LABELS!! "Dietetic" does not necessarily mean low sodium.  Salt and other sodium ingredients are often added to foods during processing.   Menu Planning Guidelines Food Group Choose More Often Avoid  Beverages (see also the milk group All fruit juices, low-sodium, salt-free vegetables juices, low-sodium carbonated beverages Regular vegetable or tomato juices, commercially softened water used for drinking or  cooking  Breads and Cereals Enriched white, wheat, rye and pumpernickel bread, hard rolls and dinner rolls; muffins, cornbread and waffles; most dry cereals, cooked cereal without added salt; unsalted crackers and breadsticks; low sodium or homemade bread crumbs Bread, rolls and crackers with salted tops; quick breads; instant hot cereals; pancakes; commercial bread stuffing; self-rising flower and biscuit mixes; regular bread crumbs or  cracker crumbs  Desserts and Sweets Desserts and sweets mad with mild should be within allowance Instant pudding mixes and cake mixes  Fats Butter or margarine; vegetable oils; unsalted salad dressings, regular salad dressings limited to 1 Tbs; light, sour and heavy cream Regular salad dressings containing bacon fat, bacon bits, and salt pork; snack dips made with instant soup mixes or processed cheese; salted nuts  Fruits Most fresh, frozen and canned fruits Fruits processed with salt or sodium-containing ingredient (some dried fruits are processed with sodium sulfites        Vegetables Fresh, frozen vegetables and low- sodium canned vegetables Regular canned vegetables, sauerkraut, pickled vegetables, and others prepared in brine; frozen vegetables in sauces; vegetables seasoned with ham, bacon or salt pork  Condiments, Sauces, Miscellaneous  Salt substitute with physician's approval; pepper, herbs, spices; vinegar, lemon or lime juice; hot pepper sauce; garlic powder, onion powder, low sodium soy sauce (1 Tbs.); low sodium condiments (ketchup, chili sauce, mustard) in limited amounts (1 tsp.) fresh ground horseradish; unsalted tortilla chips, pretzels, potato chips, popcorn, salsa (1/4 cup) Any seasoning made with salt including garlic salt, celery salt, onion salt, and seasoned salt; sea salt, rock salt, kosher salt; meat tenderizers; monosodium glutamate; mustard, regular soy sauce, barbecue, sauce, chili sauce, teriyaki sauce, steak sauce, Worcestershire sauce, and most flavored vinegars; canned gravy and mixes; regular condiments; salted snack foods, olives, picles, relish, horseradish sauce, catsup   Food preparation: Try these seasonings Meats:    Pork Sage, onion Serve with applesauce  Chicken Poultry seasoning, thyme, parsley Serve with cranberry sauce  Lamb Curry powder, rosemary, garlic, thyme Serve with mint sauce or jelly  Veal Marjoram, basil Serve with current jelly, cranberry  sauce  Beef Pepper, bay leaf Serve with dry mustard, unsalted chive butter  Fish Bay leaf, dill Serve with unsalted lemon butter, unsalted parsley butter  Vegetables:    Asparagus Lemon juice   Broccoli Lemon juice   Carrots Mustard dressing parsley, mint, nutmeg, glazed with unsalted butter and sugar   Green beans Marjoram, lemon juice, nutmeg,dill seed   Tomatoes Basil, marjoram, onion   Spice /blend for [employer]" 4 tsp ground thyme 1 tsp ground sage 3 tsp ground rosemary 4 tsp ground marjoram   Test your knowledge 1. A product that says "Salt Free" may still contain sodium. True or False 2. Garlic Powder and Hot Pepper Sauce an be used as alternative seasonings.True or False 3. Processed foods have more sodium than fresh foods.  True or False 4. Canned Vegetables have less sodium than froze True or False  WAYS TO DECREASE YOUR SODIUM INTAKE 1. Avoid the use of added salt in cooking and at the table.  Table salt (and other prepared seasonings which contain salt) is probably one of the greatest sources of sodium in the diet.  Unsalted foods can gain flavor from the sweet, sour, and butter taste sensations of herbs and spices.  Instead of using salt for seasoning, try the following seasonings with the foods listed.  Remember: how you use them to enhance natural food flavors is limited only by your creativity... Allspice-Meat,  fish, eggs, fruit, peas, red and yellow vegetables Almond Extract-Fruit baked goods Anise Seed-Sweet breads, fruit, carrots, beets, cottage cheese, cookies (tastes like licorice) Basil-Meat, fish, eggs, vegetables, rice, vegetables salads, soups, sauces Bay Leaf-Meat, fish, stews, poultry Burnet-Salad, vegetables (cucumber-like flavor) Caraway Seed-Bread, cookies, cottage cheese, meat, vegetables, cheese, rice Cardamon-Baked goods, fruit, soups Celery Powder or seed-Salads, salad dressings, sauces, meatloaf, soup, bread.Do not use  celery salt Chervil-Meats,  salads, fish, eggs, vegetables, cottage cheese (parsley-like flavor) Chili Power-Meatloaf, chicken cheese, corn, eggplant, egg dishes Chives-Salads cottage cheese, egg dishes, soups, vegetables, sauces Cilantro-Salsa, casseroles Cinnamon-Baked goods, fruit, pork, lamb, chicken, carrots Cloves-Fruit, baked goods, fish, pot roast, green beans, beets, carrots Coriander-Pastry, cookies, meat, salads, cheese (lemon-orange flavor) Cumin-Meatloaf, fish,cheese, eggs, cabbage,fruit pie (caraway flavor) [employer], fruit, eggs, fish, poultry, cottage cheese, vegetables Dill Seed-Meat, cottage cheese, poultry, vegetables, fish, salads, bread Fennel Seed-Bread, cookies, apples, pork, eggs, fish, beets, cabbage, cheese, Licorice-like flavor Garlic-(buds or powder) Salads, meat, poultry, fish, bread, butter, vegetables, potatoes.Do not  use garlic salt Ginger-Fruit, vegetables, baked goods, meat, fish, poultry Horseradish Root-Meet, vegetables, butter Lemon Juice or Extract-Vegetables, fruit, tea, baked goods, fish salads Mace-Baked goods fruit, vegetables, fish, poultry (taste like nutmeg) Maple Extract-Syrups Marjoram-Meat, chicken, fish, vegetables, breads, green salads (taste like Sage) Mint-Tea, lamb, sherbet, vegetables, desserts, carrots, cabbage Mustard, Dry or Seed-Cheese, eggs, meats, vegetables, poultry Nutmeg-Baked goods, fruit, chicken, eggs, vegetables, desserts Onion Powder-Meat, fish, poultry, vegetables, cheese, eggs, bread, rice salads (Do not use   Onion salt) Orange Extract-Desserts, baked goods Oregano-Pasta, eggs, cheese, onions, pork, lamb, fish, chicken, vegetables, green salads Paprika-Meat, fish, poultry, eggs, cheese, vegetables Parsley Flakes-Butter, vegetables, meat fish, poultry, eggs, bread, salads (certain forms may   Contain sodium Pepper-Meat fish, poultry, vegetables, eggs Peppermint Extract-Desserts, baked goods Poppy Seed-Eggs, bread, cheese, fruit  dressings, baked goods, noodles, vegetables, cottage  [employer], poultry, meat, fish, cauliflower, turnips,eggs bread Saffron-Rice, bread, veal, chicken, fish, eggs Sage-Meat, fish, poultry, onions, eggplant, tomateos, pork, stews Savory-Eggs, salads, poultry, meat, rice, vegetables, soups, pork Tarragon-Meat, poultry, fish, eggs, butter, vegetables (licorice-like flavor)  Thyme-Meat, poultry, fish, eggs, vegetables, (clover-like flavor), sauces, soups Tumeric-Salads, butter, eggs, fish, rice, vegetables (saffron-like flavor) Vanilla Extract-Baked goods, candy Vinegar-Salads, vegetables, meat marinades Walnut Extract-baked goods, candy  2. Choose your Foods Wisely   The following is a list of foods to avoid which are high in sodium:  Meats-Avoid all smoked, canned, salt cured, dried and kosher meat and fish as well as Anchovies   [employer] meats:Bologna, Liverwurst, Pastrami Canned meat or fish  Marinated herring Caviar    Pepperoni Corned Beef   Pizza Dried chipped beef  Salami Frozen breaded fish or meat Salt pork Frankfurters or hot dogs  Sardines Gefilte fish   Sausage Ham (boiled ham, Proscuitto Smoked butt    spiced ham)   Spam      TV Dinners Vegetables Canned vegetables (Regular) Relish Canned mushrooms  Sauerkraut Olives    Tomato juice Pickles  Bakery and Dessert Products Canned puddings  Cream pies Cheesecake   Decorated cakes Cookies  Beverages/Juices Tomato juice, regular  Gatorade   V-8 vegetable juice, regular  Breads and Cereals Biscuit mixes   Salted potato chips, corn chips, pretzels Bread stuffing mixes  Salted crackers and rolls Pancake and waffle mixes Self-rising flour  Seasonings Accent    Meat sauces Barbecue sauce  Meat tenderizer Catsup    Monosodium glutamate (MSG) Celery salt   Onion salt Chili sauce   Prepared  mustard Garlic salt   Salt, seasoned salt, sea salt Gravy mixes   Soy  sauce Horseradish   Steak sauce Ketchup   Tartar sauce Lite salt    Teriyaki sauce Marinade mixes   Worcestershire sauce  Others Baking powder   Cocoa and cocoa mixes Baking soda   Commercial casserole mixes Candy-caramels, chocolate  Dehydrated soups    Bars, fudge,nougats  Instant rice and pasta mixes Canned broth or soup  Maraschino cherries Cheese, aged and processed cheese and cheese spreads  Learning Assessment Quiz  Indicated T (for True) or F (for False) for each of the following statements:  1. _____ Fresh fruits and vegetables and unprocessed grains are generally low in sodium 2. _____ Water may contain a considerable amount of sodium, depending on the source 3. _____ You can always tell if a food is high in sodium by tasting it 4. _____ Certain laxatives my be high in sodium and should be avoided unless prescribed   by a physician or pharmacist 5. _____ Salt substitutes may be used freely by anyone on a sodium restricted diet 6. _____ Sodium is present in table salt, food additives and as a natural component of   most foods 7. _____ Table salt is approximately 90% sodium 8. _____ Limiting sodium intake may help prevent excess fluid accumulation in the body 9. _____ On a sodium-restricted diet, seasonings such as bouillon soy sauce, and    cooking wine should be used in place of table salt 10. _____ On an ingredient list, a product which lists monosodium glutamate as the first   ingredient is an appropriate food to include on a low sodium diet  Circle the best answer(s) to the following statements (Hint: there may be more than one correct answer)  11. On a low-sodium diet, some acceptable snack items are:    A. Olives  F. Bean dip   K. Grapefruit juice    B. Salted Pretzels G. Commercial Popcorn   L. Canned peaches    C. Carrot Sticks  H. Bouillon   M. Unsalted nuts   D. [place] fries  I. Peanut butter crackers N. Salami   E. Sweet pickles J. Tomato Juice   O.  Pizza  12.  Seasonings that may be used freely on a reduced - sodium diet include   A. Lemon wedges F.Monosodium glutamate K. Celery seed    B.Soysauce   G. Pepper   L. Mustard powder   C. Sea salt  H. Cooking wine  M. Onion flakes   D. Vinegar  E. Prepared horseradish N. Salsa   E. Sage   J. Worcestershire sauce  O. Chutney

## 2018-12-07 ENCOUNTER — Telehealth: Payer: Self-pay | Admitting: Physician Assistant

## 2018-12-07 DIAGNOSIS — R293 Abnormal posture: Secondary | ICD-10-CM | POA: Diagnosis not present

## 2018-12-07 DIAGNOSIS — R7989 Other specified abnormal findings of blood chemistry: Secondary | ICD-10-CM

## 2018-12-07 DIAGNOSIS — M546 Pain in thoracic spine: Secondary | ICD-10-CM | POA: Diagnosis not present

## 2018-12-07 DIAGNOSIS — I10 Essential (primary) hypertension: Secondary | ICD-10-CM

## 2018-12-07 DIAGNOSIS — Z79899 Other long term (current) drug therapy: Secondary | ICD-10-CM

## 2018-12-07 NOTE — Telephone Encounter (Signed)
Pt verbalized understanding of his labwork and will return 12/21/18 for repeat BMET.

## 2018-12-07 NOTE — Telephone Encounter (Signed)
Follow Up:    Pt said he received a call yesterday. He was told to call back and ask for the Triage Nurse. It is concerning his lab results.

## 2018-12-12 ENCOUNTER — Other Ambulatory Visit: Payer: Self-pay | Admitting: Cardiology

## 2018-12-12 DIAGNOSIS — R293 Abnormal posture: Secondary | ICD-10-CM | POA: Diagnosis not present

## 2018-12-12 DIAGNOSIS — M546 Pain in thoracic spine: Secondary | ICD-10-CM | POA: Diagnosis not present

## 2018-12-12 MED ORDER — APIXABAN 5 MG PO TABS: 5.0000 mg | ORAL_TABLET | Freq: Two times a day (BID) | ORAL | 1 refills | Status: DC

## 2018-12-12 NOTE — Telephone Encounter (Signed)
New Message   Patient gets his medication refilled at the [facility] hospital. The [facility] hospital is requiring the prescription to be faxed as well as the most recent labs and the last progress note from the previous visit.   *STAT* If patient is at the pharmacy, call can be transferred to refill team.   1. Which medications need to be refilled? (please list name of each medication and dose if known) apixaban (ELIQUIS) 5 MG TABS tablet  2. Which pharmacy/location (including street and city if local pharmacy) is medication to be sent to?  [facility] in [place]: Dr. Domenica Fail Fax # [phone]  3. Do they need a 30 day or 90 day supply? 90 day supply

## 2018-12-12 NOTE — Telephone Encounter (Signed)
Last OV 12/05/2018 Scr 1.3 on 12/05/2018 85 years old 39kg Rx for Elqiuis, labs and OV notes faxed to [place]

## 2018-12-15 DIAGNOSIS — M546 Pain in thoracic spine: Secondary | ICD-10-CM | POA: Diagnosis not present

## 2018-12-15 DIAGNOSIS — R293 Abnormal posture: Secondary | ICD-10-CM | POA: Diagnosis not present

## 2018-12-21 ENCOUNTER — Other Ambulatory Visit: Payer: Medicare Other | Admitting: *Deleted

## 2018-12-21 ENCOUNTER — Other Ambulatory Visit: Payer: Self-pay

## 2018-12-21 DIAGNOSIS — Z79899 Other long term (current) drug therapy: Secondary | ICD-10-CM | POA: Diagnosis not present

## 2018-12-21 DIAGNOSIS — R7989 Other specified abnormal findings of blood chemistry: Secondary | ICD-10-CM | POA: Diagnosis not present

## 2018-12-21 DIAGNOSIS — I10 Essential (primary) hypertension: Secondary | ICD-10-CM | POA: Diagnosis not present

## 2018-12-21 DIAGNOSIS — R293 Abnormal posture: Secondary | ICD-10-CM | POA: Diagnosis not present

## 2018-12-21 DIAGNOSIS — M546 Pain in thoracic spine: Secondary | ICD-10-CM | POA: Diagnosis not present

## 2018-12-22 DIAGNOSIS — R293 Abnormal posture: Secondary | ICD-10-CM | POA: Diagnosis not present

## 2018-12-22 DIAGNOSIS — M546 Pain in thoracic spine: Secondary | ICD-10-CM | POA: Diagnosis not present

## 2018-12-22 LAB — BASIC METABOLIC PANEL
BUN/Creatinine Ratio: 16 (ref 10–24)
BUN: 20 mg/dL (ref 8–27)
CO2: 24 mmol/L (ref 20–29)
Calcium: 9.1 mg/dL (ref 8.6–10.2)
Chloride: 101 mmol/L (ref 96–106)
Creatinine, Ser: 1.27 mg/dL (ref 0.76–1.27)
GFR calc Af Amer: 58 mL/min/{1.73_m2} — ABNORMAL LOW (ref >59)
GFR calc non Af Amer: 50 mL/min/{1.73_m2} — ABNORMAL LOW (ref >59)
Glucose: 109 mg/dL — ABNORMAL HIGH (ref 65–99)
Potassium: 4.2 mmol/L (ref 3.5–5.2)
Sodium: 139 mmol/L (ref 134–144)

## 2018-12-26 DIAGNOSIS — M546 Pain in thoracic spine: Secondary | ICD-10-CM | POA: Diagnosis not present

## 2018-12-26 DIAGNOSIS — R293 Abnormal posture: Secondary | ICD-10-CM | POA: Diagnosis not present

## 2019-01-02 DIAGNOSIS — M546 Pain in thoracic spine: Secondary | ICD-10-CM | POA: Diagnosis not present

## 2019-01-02 DIAGNOSIS — R293 Abnormal posture: Secondary | ICD-10-CM | POA: Diagnosis not present

## 2019-01-09 DIAGNOSIS — M546 Pain in thoracic spine: Secondary | ICD-10-CM | POA: Diagnosis not present

## 2019-01-09 DIAGNOSIS — R293 Abnormal posture: Secondary | ICD-10-CM | POA: Diagnosis not present

## 2019-01-16 DIAGNOSIS — R293 Abnormal posture: Secondary | ICD-10-CM | POA: Diagnosis not present

## 2019-01-16 DIAGNOSIS — M546 Pain in thoracic spine: Secondary | ICD-10-CM | POA: Diagnosis not present

## 2019-01-17 DIAGNOSIS — D225 Melanocytic nevi of trunk: Secondary | ICD-10-CM | POA: Diagnosis not present

## 2019-01-17 DIAGNOSIS — Z08 Encounter for follow-up examination after completed treatment for malignant neoplasm: Secondary | ICD-10-CM | POA: Diagnosis not present

## 2019-01-17 DIAGNOSIS — L718 Other rosacea: Secondary | ICD-10-CM | POA: Diagnosis not present

## 2019-01-17 DIAGNOSIS — Z1283 Encounter for screening for malignant neoplasm of skin: Secondary | ICD-10-CM | POA: Diagnosis not present

## 2019-01-17 DIAGNOSIS — Z85828 Personal history of other malignant neoplasm of skin: Secondary | ICD-10-CM | POA: Diagnosis not present

## 2019-01-17 DIAGNOSIS — L570 Actinic keratosis: Secondary | ICD-10-CM | POA: Diagnosis not present

## 2019-01-17 DIAGNOSIS — X32XXXA Exposure to sunlight, initial encounter: Secondary | ICD-10-CM | POA: Diagnosis not present

## 2019-02-07 DIAGNOSIS — Z23 Encounter for immunization: Secondary | ICD-10-CM | POA: Diagnosis not present

## 2019-03-06 ENCOUNTER — Other Ambulatory Visit: Payer: Self-pay | Admitting: Cardiology

## 2019-03-06 MED ORDER — SOTALOL HCL 80 MG PO TABS: 40.0000 mg | ORAL_TABLET | ORAL | 0 refills | Status: DC

## 2019-03-06 NOTE — Telephone Encounter (Signed)
New Message      *STAT* If patient is at the pharmacy, call can be transferred to refill team.   1. Which medications need to be refilled? (please list name of each medication and dose if known) Sotalol 80 mg   2. Which pharmacy/location (including street and city if local pharmacy) is medication to be sent to? [facility] on [address]   3. Do they need a 30 day or 90 day supply? 30 tablets, he takes 1/2 a week and would like 30 tablets

## 2019-04-02 DIAGNOSIS — M9903 Segmental and somatic dysfunction of lumbar region: Secondary | ICD-10-CM | POA: Diagnosis not present

## 2019-04-02 DIAGNOSIS — M9904 Segmental and somatic dysfunction of sacral region: Secondary | ICD-10-CM | POA: Diagnosis not present

## 2019-04-02 DIAGNOSIS — M9905 Segmental and somatic dysfunction of pelvic region: Secondary | ICD-10-CM | POA: Diagnosis not present

## 2019-04-02 DIAGNOSIS — M545 Low back pain: Secondary | ICD-10-CM | POA: Diagnosis not present

## 2019-06-07 ENCOUNTER — Other Ambulatory Visit: Payer: Self-pay

## 2019-06-07 ENCOUNTER — Ambulatory Visit (INDEPENDENT_AMBULATORY_CARE_PROVIDER_SITE_OTHER): Payer: Medicare Other | Admitting: Cardiology

## 2019-06-07 ENCOUNTER — Encounter: Payer: Self-pay | Admitting: Cardiology

## 2019-06-07 VITALS — BP 132/78 | HR 62 | Ht 68.5 in | Wt 176.2 lb

## 2019-06-07 DIAGNOSIS — E785 Hyperlipidemia, unspecified: Secondary | ICD-10-CM

## 2019-06-07 DIAGNOSIS — I480 Paroxysmal atrial fibrillation: Secondary | ICD-10-CM | POA: Diagnosis not present

## 2019-06-07 DIAGNOSIS — I2510 Atherosclerotic heart disease of native coronary artery without angina pectoris: Secondary | ICD-10-CM

## 2019-06-07 LAB — CBC
Hematocrit: 37.1 % — ABNORMAL LOW (ref 37.5–51.0)
Hemoglobin: 12.4 g/dL — ABNORMAL LOW (ref 13.0–17.7)
MCH: 31.2 pg (ref 26.6–33.0)
MCHC: 33.4 g/dL (ref 31.5–35.7)
MCV: 94 fL (ref 79–97)
Platelets: 184 10*3/uL (ref 150–450)
RBC: 3.97 x10E6/uL — ABNORMAL LOW (ref 4.14–5.80)
RDW: 11.8 % (ref 11.6–15.4)
WBC: 5.9 10*3/uL (ref 3.4–10.8)

## 2019-06-07 LAB — BASIC METABOLIC PANEL
BUN/Creatinine Ratio: 13 (ref 10–24)
BUN: 18 mg/dL (ref 8–27)
CO2: 22 mmol/L (ref 20–29)
Calcium: 9 mg/dL (ref 8.6–10.2)
Chloride: 101 mmol/L (ref 96–106)
Creatinine, Ser: 1.34 mg/dL — ABNORMAL HIGH (ref 0.76–1.27)
GFR calc Af Amer: 54 mL/min/{1.73_m2} — ABNORMAL LOW (ref >59)
GFR calc non Af Amer: 47 mL/min/{1.73_m2} — ABNORMAL LOW (ref >59)
Glucose: 98 mg/dL (ref 65–99)
Potassium: 4.3 mmol/L (ref 3.5–5.2)
Sodium: 136 mmol/L (ref 134–144)

## 2019-06-07 NOTE — Patient Instructions (Signed)
Medication Instructions:  Your physician recommends that you continue on your current medications as directed. Please refer to the Current Medication list given to you today.  *If you need a refill on your cardiac medications before your next appointment, please call your pharmacy*   Lab Work: TODAY: BMET and CBC If you have labs (blood work) drawn today and your tests are completely normal, you will receive your results only by: MyChart Message (if you have MyChart) OR A paper copy in the mail If you have any lab test that is abnormal or we need to change your treatment, we will call you to review the results.  Follow-Up: At [facility], you and your health needs are our priority.  As part of our continuing mission to provide you with exceptional heart care, we have created designated Provider Care Teams.  These Care Teams include your primary Cardiologist (physician) and Advanced Practice Providers (APPs -  Physician Assistants and Nurse Practitioners) who all work together to provide you with the care you need, when you need it.   Your next appointment:   6 month(s)  The format for your next appointment:   In Person  Provider:   You may see Traci Turner, MD or one of the following Advanced Practice Providers on your designated Care Team:   Dayna Dunn, PA-C Michele Lenze, PA-C   

## 2019-06-07 NOTE — Progress Notes (Signed)
Cardiology Office Note:    Date:  06/07/2019   ID:  George Cain, DOB Dec 11, 1929, MRN [id number]  PCP:  [name], MD  Cardiologist:  Fransico Him, MD    Referring MD: [name], MD   Chief Complaint  Patient presents with  . Coronary Artery Disease  . Atrial Fibrillation  . Hyperlipidemia    History of Present Illness:    George Cain is a 86 y.o. male with a hx of PAF s/p RFA 07/2012,nonobstructive ASCAD with 20% LAD by cath 2011and dyslipidemia. He has declined anticoagulation in the past. He was seen by Dr. Caryl Comes due to bradycardia on Sotolol and the sotolol was stopped. He continued to refuse anticoagulation. He started to have palpitations again and he restarted Sotolol at 40mg  weekly.  He is here today for followup and is doing well.  He denies any chest pain or pressure, SOB, DOE, PND, orthopnea, LE edema, dizziness, palpitations or syncope. He is compliant with his meds and is tolerating meds with no SE.    Past Medical History:  Diagnosis Date  . Allergic rhinitis   . BPH (benign prostatic hypertrophy)   . CHF (congestive heart failure) ([place])   . Coronary artery disease 2011   nonobstructive ASCAD with 20% LAD s  . Hyperlipidemia    Patient refused statins  . PAF (paroxysmal atrial fibrillation) [facility])    Patient refused anticoagulation    Past Surgical History:  Procedure Laterality Date  . CARDIAC CATHETERIZATION  2011   Nonobstructive ASCAD w 20% LAD  . COLONOSCOPY  06/15/2010  . RADIOFREQUENCY ABLATION  08/23/12  . TONSILLECTOMY  1940   as child    Current Medications: Current Meds  Medication Sig  . acetaminophen (TYLENOL) 500 MG tablet Take 500 mg by mouth 2 (two) times daily.  Marland Kitchen apixaban (ELIQUIS) 5 MG TABS tablet Take 1 tablet (5 mg total) by mouth 2 (two) times daily.  . B Complex-C (SUPER B COMPLEX/VITAMIN C) TABS Take 1 tablet by mouth daily.  . cetirizine (ZYRTEC) 10 MG tablet Take 10 mg by mouth daily.   . cyclobenzaprine  (FLEXERIL) 10 MG tablet Take 10 mg by mouth 3 (three) times daily as needed for muscle spasms.   . finasteride (PROSCAR) 5 MG tablet Take 5 mg by mouth daily.  . fluticasone (FLONASE) 50 MCG/ACT nasal spray Place 2 sprays into both nostrils daily.  Marland Kitchen ipratropium (ATROVENT HFA) 17 MCG/ACT inhaler Inhale 2 puffs into the lungs as directed.  . magnesium oxide (MAG-OX) 400 MG tablet Take 400 mg by mouth daily.  . Multiple Vitamin (MULTI-VITAMINS) TABS Take by mouth daily.   . Multiple Vitamin (MULTIVITAMIN WITH MINERALS) TABS tablet Take 1 tablet by mouth daily.  . Multiple Vitamins-Minerals (ICAPS AREDS 2 PO) Take by mouth daily.  . polyethylene glycol (MIRALAX / GLYCOLAX) packet Take 17 g by mouth daily as needed for moderate constipation.  . Potassium 99 MG TABS Take by mouth.  . sotalol (BETAPACE) 80 MG tablet Take 0.5 tablets (40 mg total) by mouth once a week. Please keep upcoming appt for future refills. Thank you     Allergies:   Keflex [cephalexin]   Social History   Socioeconomic History  . Marital status: Married    Spouse name: Not on file  . Number of children: Not on file  . Years of education: Not on file  . Highest education level: Not on file  Occupational History  . Not on file  Tobacco Use  . Smoking  status: Never Smoker  . Smokeless tobacco: Never Used  Substance and Sexual Activity  . Alcohol use: No    Alcohol/week: 0.0 standard drinks  . Drug use: No  . Sexual activity: Not on file  Other Topics Concern  . Not on file  Social History Narrative  . Not on file   Social Determinants of Health   Financial Resource Strain:   . Difficulty of Paying Living Expenses: Not on file  Food Insecurity:   . Worried About Charity fundraiser in the Last Year: Not on file  . Ran Out of Food in the Last Year: Not on file  Transportation Needs:   . Lack of Transportation (Medical): Not on file  . Lack of Transportation (Non-Medical): Not on file  Physical Activity:    . Days of Exercise per Week: Not on file  . Minutes of Exercise per Session: Not on file  Stress:   . Feeling of Stress : Not on file  Social Connections:   . Frequency of Communication with Friends and Family: Not on file  . Frequency of Social Gatherings with Friends and Family: Not on file  . Attends Religious Services: Not on file  . Active Member of Clubs or Organizations: Not on file  . Attends Archivist Meetings: Not on file  . Marital Status: Not on file     Family History: The patient's family history includes CAD in his father; Diabetes in his father; Heart attack in his father; Hypertension in his mother; Prostate cancer in his father.  ROS:   Please see the history of present illness.    ROS  All other systems reviewed and negative.   EKGs/Labs/Other Studies Reviewed:    The following studies were reviewed today: Outside labs on [facility]  EKG:  EKG is  ordered today.  The ekg ordered today demonstrates NSR at 62bpm with normal intervals and no ST changes  Recent Labs: 12/05/2018: Hemoglobin 12.7; Platelets 194 12/21/2018: BUN 20; Creatinine, Ser 1.27; Potassium 4.2; Sodium 139   Recent Lipid Panel    Component Value Date/Time   CHOL 176 09/25/2015 1011   TRIG 111 09/25/2015 1011   HDL 68 09/25/2015 1011   CHOLHDL 2.6 09/25/2015 1011   VLDL 22 09/25/2015 1011   LDLCALC 86 09/25/2015 1011    Physical Exam:    VS:  BP 132/78   Pulse 62   Ht 5' 8.5" (1.74 m)   Wt 176 lb 3.2 oz (79.9 kg)   BMI 26.40 kg/m     Wt Readings from Last 3 Encounters:  06/07/19 176 lb 3.2 oz (79.9 kg)  12/05/18 174 lb 6.4 oz (79.1 kg)  05/02/18 175 lb 6.4 oz (79.6 kg)     GEN:  Well nourished, well developed in no acute distress HEENT: Normal NECK: No JVD; No carotid bruits LYMPHATICS: No lymphadenopathy CARDIAC: RRR, no murmurs, rubs, gallops RESPIRATORY:  Clear to auscultation without rales, wheezing or rhonchi  ABDOMEN: Soft, non-tender,  non-distended MUSCULOSKELETAL:  No edema; No deformity  SKIN: Warm and dry NEUROLOGIC:  Alert and oriented x 3 PSYCHIATRIC:  Normal affect   ASSESSMENT:    1. Coronary artery disease involving native coronary artery of native heart without angina pectoris   2. PAF (paroxysmal atrial fibrillation) ([place])   3. Dyslipidemia    PLAN:    In order of problems listed above:  1.  ASCAD -nonobstructive ASCAD with 20% LAD by cath 2011.   -he denies any anginal sx -  no ASA due to DOAC -he has refused statin therapy  2.  PAF  -he continues to maintain NSR on low dose Sotolol 40mg  BID -QTc stable on EKG today  At 329msec -he has refused anticogtlation in the past but is now on Eliquis 5mg  BID -he denies any significant bleeding issues -repeat BMET and CBC today - his last Creatinine was 1.2 in August  3.  HLD -LDL goal is < 70 but he has refused statin therapy   Medication Adjustments/Labs and Tests Ordered: Current medicines are reviewed at length with the patient today.  Concerns regarding medicines are outlined above.  Orders Placed This Encounter  Procedures  . EKG 12-Lead   No orders of the defined types were placed in this encounter.   Signed, Fransico Him, MD  06/07/2019 10:34 AM    George Cain
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
## 2019-12-05 ENCOUNTER — Other Ambulatory Visit: Payer: Self-pay

## 2019-12-05 ENCOUNTER — Ambulatory Visit (INDEPENDENT_AMBULATORY_CARE_PROVIDER_SITE_OTHER): Payer: Medicare Other | Admitting: Physician Assistant

## 2019-12-05 ENCOUNTER — Encounter: Payer: Self-pay | Admitting: Physician Assistant

## 2019-12-05 VITALS — BP 154/86 | HR 65 | Ht 68.5 in | Wt 174.6 lb

## 2019-12-05 DIAGNOSIS — I10 Essential (primary) hypertension: Secondary | ICD-10-CM

## 2019-12-05 DIAGNOSIS — I2510 Atherosclerotic heart disease of native coronary artery without angina pectoris: Secondary | ICD-10-CM | POA: Diagnosis not present

## 2019-12-05 DIAGNOSIS — E785 Hyperlipidemia, unspecified: Secondary | ICD-10-CM

## 2019-12-05 DIAGNOSIS — I480 Paroxysmal atrial fibrillation: Secondary | ICD-10-CM | POA: Diagnosis not present

## 2019-12-05 MED ORDER — SOTALOL HCL 80 MG PO TABS: 40.0000 mg | ORAL_TABLET | ORAL | 1 refills | Status: DC

## 2019-12-05 NOTE — Patient Instructions (Signed)
Medication Instructions:  Your physician recommends that you continue on your current medications as directed. Please refer to the Current Medication list given to you today.  *If you need a refill on your cardiac medications before your next appointment, please call your pharmacy*   Lab Work: TODAY: BMET, CBC  If you have labs (blood work) drawn today and your tests are completely normal, you will receive your results only by:  [place] (if you have MyChart) OR  A paper copy in the mail If you have any lab test that is abnormal or we need to change your treatment, we will call you to review the results.   Testing/Procedures: None   Follow-Up: At [facility], you and your health needs are our priority.  As part of our continuing mission to provide you with exceptional heart care, we have created designated Provider Care Teams.  These Care Teams include your primary Cardiologist (physician) and Advanced Practice Providers (APPs -  Physician Assistants and Nurse Practitioners) who all work together to provide you with the care you need, when you need it.  We recommend signing up for the patient portal called "MyChart".  Sign up information is provided on this After Visit Summary.  MyChart is used to connect with patients for Virtual Visits (Telemedicine).  Patients are able to view lab/test results, encounter notes, upcoming appointments, etc.  Non-urgent messages can be sent to your provider as well.   To learn more about what you can do with MyChart, go to [email or web address].    Your next appointment:   6 month(s)  The format for your next appointment:   In Person  Provider:   You may see Fransico Him, MD or one of the following Advanced Practice Providers on your designated Care Team:    Melina Copa, PA-C  Ermalinda Barrios, PA-C    Other Instructions Check your blood pressure for 2 weeks and send [place] the readings through your MyChart.    Two Gram Sodium Diet  2000 mg  What is Sodium? Sodium is a mineral found naturally in many foods. The most significant source of sodium in the diet is table salt, which is about 40% sodium.  Processed, convenience, and preserved foods also contain a large amount of sodium.  The body needs only 500 mg of sodium daily to function,  A normal diet provides more than enough sodium even if you do not use salt.  Why Limit Sodium? A build up of sodium in the body can cause thirst, increased blood pressure, shortness of breath, and water retention.  Decreasing sodium in the diet can reduce edema and risk of heart attack or stroke associated with high blood pressure.  Keep in mind that there are many other factors involved in these health problems.  Heredity, obesity, lack of exercise, cigarette smoking, stress and what you eat all play a role.  General Guidelines:  Do not add salt at the table or in cooking.  One teaspoon of salt contains over 2 grams of sodium.  Read food labels  Avoid processed and convenience foods  Ask your dietitian before eating any foods not dicussed in the menu planning guidelines  Consult your physician if you wish to use a salt substitute or a sodium containing medication such as antacids.  Limit milk and milk products to 16 oz (2 cups) per day.  Shopping Hints:  READ LABELS!! "Dietetic" does not necessarily mean low sodium.  Salt and other sodium ingredients are often added to foods  during processing.   Menu Planning Guidelines Food Group Choose More Often Avoid  Beverages (see also the milk group All fruit juices, low-sodium, salt-free vegetables juices, low-sodium carbonated beverages Regular vegetable or tomato juices, commercially softened water used for drinking or cooking  Breads and Cereals Enriched white, wheat, rye and pumpernickel bread, hard rolls and dinner rolls; muffins, cornbread and waffles; most dry cereals, cooked cereal without added salt; unsalted crackers and  breadsticks; low sodium or homemade bread crumbs Bread, rolls and crackers with salted tops; quick breads; instant hot cereals; pancakes; commercial bread stuffing; self-rising flower and biscuit mixes; regular bread crumbs or cracker crumbs  Desserts and Sweets Desserts and sweets mad with mild should be within allowance Instant pudding mixes and cake mixes  Fats Butter or margarine; vegetable oils; unsalted salad dressings, regular salad dressings limited to 1 Tbs; light, sour and heavy cream Regular salad dressings containing bacon fat, bacon bits, and salt pork; snack dips made with instant soup mixes or processed cheese; salted nuts  Fruits Most fresh, frozen and canned fruits Fruits processed with salt or sodium-containing ingredient (some dried fruits are processed with sodium sulfites        Vegetables Fresh, frozen vegetables and low- sodium canned vegetables Regular canned vegetables, sauerkraut, pickled vegetables, and others prepared in brine; frozen vegetables in sauces; vegetables seasoned with ham, bacon or salt pork  Condiments, Sauces, Miscellaneous  Salt substitute with physician's approval; pepper, herbs, spices; vinegar, lemon or lime juice; hot pepper sauce; garlic powder, onion powder, low sodium soy sauce (1 Tbs.); low sodium condiments (ketchup, chili sauce, mustard) in limited amounts (1 tsp.) fresh ground horseradish; unsalted tortilla chips, pretzels, potato chips, popcorn, salsa (1/4 cup) Any seasoning made with salt including garlic salt, celery salt, onion salt, and seasoned salt; sea salt, rock salt, kosher salt; meat tenderizers; monosodium glutamate; mustard, regular soy sauce, barbecue, sauce, chili sauce, teriyaki sauce, steak sauce, Worcestershire sauce, and most flavored vinegars; canned gravy and mixes; regular condiments; salted snack foods, olives, picles, relish, horseradish sauce, catsup   Food preparation: Try these seasonings Meats:    Pork Sage, onion  Serve with applesauce  Chicken Poultry seasoning, thyme, parsley Serve with cranberry sauce  Lamb Curry powder, rosemary, garlic, thyme Serve with mint sauce or jelly  Veal Marjoram, basil Serve with current jelly, cranberry sauce  Beef Pepper, bay leaf Serve with dry mustard, unsalted chive butter  Fish [place] leaf, dill Serve with unsalted lemon butter, unsalted parsley butter  Vegetables:    Asparagus Lemon juice   Broccoli Lemon juice   Carrots Mustard dressing parsley, mint, nutmeg, glazed with unsalted butter and sugar   Green beans Marjoram, lemon juice, nutmeg,dill seed   Tomatoes Basil, marjoram, onion   Spice /blend for [employer]" 4 tsp ground thyme 1 tsp ground sage 3 tsp ground rosemary 4 tsp ground marjoram   Test your knowledge 1. A product that says "Salt Free" may still contain sodium. True or False 2. Garlic Powder and Hot Pepper Sauce an be used as alternative seasonings.True or False 3. Processed foods have more sodium than fresh foods.  True or False 4. Canned Vegetables have less sodium than froze True or False  WAYS TO DECREASE YOUR SODIUM INTAKE 1. Avoid the use of added salt in cooking and at the table.  Table salt (and other prepared seasonings which contain salt) is probably one of the greatest sources of sodium in the diet.  Unsalted foods can gain flavor from the sweet,  sour, and butter taste sensations of herbs and spices.  Instead of using salt for seasoning, try the following seasonings with the foods listed.  Remember: how you use them to enhance natural food flavors is limited only by your creativity... Allspice-Meat, fish, eggs, fruit, peas, red and yellow vegetables Almond Extract-Fruit baked goods Anise Seed-Sweet breads, fruit, carrots, beets, cottage cheese, cookies (tastes like licorice) Basil-Meat, fish, eggs, vegetables, rice, vegetables salads, soups, sauces Bay Leaf-Meat, fish, stews, poultry Burnet-Salad, vegetables (cucumber-like  flavor) Caraway Seed-Bread, cookies, cottage cheese, meat, vegetables, cheese, rice Cardamon-Baked goods, fruit, soups Celery Powder or seed-Salads, salad dressings, sauces, meatloaf, soup, bread.Do not use  celery salt Chervil-Meats, salads, fish, eggs, vegetables, cottage cheese (parsley-like flavor) Chili Power-Meatloaf, chicken cheese, corn, eggplant, egg dishes Chives-Salads cottage cheese, egg dishes, soups, vegetables, sauces Cilantro-Salsa, casseroles Cinnamon-Baked goods, fruit, pork, lamb, chicken, carrots Cloves-Fruit, baked goods, fish, pot roast, green beans, beets, carrots Coriander-Pastry, cookies, meat, salads, cheese (lemon-orange flavor) Cumin-Meatloaf, fish,cheese, eggs, cabbage,fruit pie (caraway flavor) [employer], fruit, eggs, fish, poultry, cottage cheese, vegetables Dill Seed-Meat, cottage cheese, poultry, vegetables, fish, salads, bread Fennel Seed-Bread, cookies, apples, pork, eggs, fish, beets, cabbage, cheese, Licorice-like flavor Garlic-(buds or powder) Salads, meat, poultry, fish, bread, butter, vegetables, potatoes.Do not  use garlic salt Ginger-Fruit, vegetables, baked goods, meat, fish, poultry Horseradish Root-Meet, vegetables, butter Lemon Juice or Extract-Vegetables, fruit, tea, baked goods, fish salads Mace-Baked goods fruit, vegetables, fish, poultry (taste like nutmeg) Maple Extract-Syrups Marjoram-Meat, chicken, fish, vegetables, breads, green salads (taste like Sage) Mint-Tea, lamb, sherbet, vegetables, desserts, carrots, cabbage Mustard, Dry or Seed-Cheese, eggs, meats, vegetables, poultry Nutmeg-Baked goods, fruit, chicken, eggs, vegetables, desserts Onion Powder-Meat, fish, poultry, vegetables, cheese, eggs, bread, rice salads (Do not use   Onion salt) Orange Extract-Desserts, baked goods Oregano-Pasta, eggs, cheese, onions, pork, lamb, fish, chicken, vegetables, green salads Paprika-Meat, fish, poultry, eggs, cheese, vegetables Parsley  Flakes-Butter, vegetables, meat fish, poultry, eggs, bread, salads (certain forms may   Contain sodium Pepper-Meat fish, poultry, vegetables, eggs Peppermint Extract-Desserts, baked goods Poppy Seed-Eggs, bread, cheese, fruit dressings, baked goods, noodles, vegetables, cottage  [employer], poultry, meat, fish, cauliflower, turnips,eggs bread Saffron-Rice, bread, veal, chicken, fish, eggs Sage-Meat, fish, poultry, onions, eggplant, tomateos, pork, stews Savory-Eggs, salads, poultry, meat, rice, vegetables, soups, pork Tarragon-Meat, poultry, fish, eggs, butter, vegetables (licorice-like flavor)  Thyme-Meat, poultry, fish, eggs, vegetables, (clover-like flavor), sauces, soups Tumeric-Salads, butter, eggs, fish, rice, vegetables (saffron-like flavor) Vanilla Extract-Baked goods, candy Vinegar-Salads, vegetables, meat marinades Walnut Extract-baked goods, candy  2. Choose your Foods Wisely   The following is a list of foods to avoid which are high in sodium:  Meats-Avoid all smoked, canned, salt cured, dried and kosher meat and fish as well as Anchovies   [employer] meats:Bologna, Liverwurst, Pastrami Canned meat or fish  Marinated herring Caviar    Pepperoni Corned Beef   Pizza Dried chipped beef  Salami Frozen breaded fish or meat Salt pork Frankfurters or hot dogs  Sardines Gefilte fish   Sausage Ham (boiled ham, Proscuitto Smoked butt    spiced ham)   Spam      TV Dinners Vegetables Canned vegetables (Regular) Relish Canned mushrooms  Sauerkraut Olives    Tomato juice Pickles  Bakery and Dessert Products Canned puddings  Cream pies Cheesecake   Decorated cakes Cookies  Beverages/Juices Tomato juice, regular  Gatorade   V-8 vegetable juice, regular  Breads and Cereals Biscuit mixes   Salted potato chips, corn chips, pretzels Bread stuffing mixes  Salted crackers and rolls Pancake and waffle mixes Self-rising  flour  Seasonings Accent    Meat sauces Barbecue sauce  Meat tenderizer Catsup    Monosodium glutamate (MSG) Celery salt   Onion salt Chili sauce   Prepared mustard Garlic salt   Salt, seasoned salt, sea salt Gravy mixes   Soy sauce Horseradish   Steak sauce Ketchup   Tartar sauce Lite salt    Teriyaki sauce Marinade mixes   Worcestershire sauce  Others Baking powder   Cocoa and cocoa mixes Baking soda   Commercial casserole mixes Candy-caramels, chocolate  Dehydrated soups    Bars, fudge,nougats  Instant rice and pasta mixes Canned broth or soup  Maraschino cherries Cheese, aged and processed cheese and cheese spreads  Learning Assessment Quiz  Indicated T (for True) or F (for False) for each of the following statements:  1. _____ Fresh fruits and vegetables and unprocessed grains are generally low in sodium 2. _____ Water may contain a considerable amount of sodium, depending on the source 3. _____ You can always tell if a food is high in sodium by tasting it 4. _____ Certain laxatives my be high in sodium and should be avoided unless prescribed   by a physician or pharmacist 5. _____ Salt substitutes may be used freely by anyone on a sodium restricted diet 6. _____ Sodium is present in table salt, food additives and as a natural component of   most foods 7. _____ Table salt is approximately 90% sodium 8. _____ Limiting sodium intake may help prevent excess fluid accumulation in the body 9. _____ On a sodium-restricted diet, seasonings such as bouillon soy sauce, and    cooking wine should be used in place of table salt 10. _____ On an ingredient list, a product which lists monosodium glutamate as the first   ingredient is an appropriate food to include on a low sodium diet  Circle the best answer(s) to the following statements (Hint: there may be more than one correct answer)  11. On a low-sodium diet, some acceptable snack items are:    A. Olives  F. Bean dip   K.  Grapefruit juice    B. Salted Pretzels G. Commercial Popcorn   L. Canned peaches    C. Carrot Sticks  H. Bouillon   M. Unsalted nuts   D. [place] fries  I. Peanut butter crackers N. Salami   E. Sweet pickles J. Tomato Juice   O. Pizza  12.  Seasonings that may be used freely on a reduced - sodium diet include   A. Lemon wedges F.Monosodium glutamate K. Celery seed    B.Soysauce   G. Pepper   L. Mustard powder   C. Sea salt  H. Cooking wine  M. Onion flakes   D. Vinegar  E. Prepared horseradish N. Salsa   E. Sage   J. Worcestershire sauce  O. Chutney

## 2019-12-06 LAB — BASIC METABOLIC PANEL
BUN/Creatinine Ratio: 16 (ref 10–24)
BUN: 22 mg/dL (ref 8–27)
CO2: 24 mmol/L (ref 20–29)
Calcium: 9.6 mg/dL (ref 8.6–10.2)
Chloride: 99 mmol/L (ref 96–106)
Creatinine, Ser: 1.36 mg/dL — ABNORMAL HIGH (ref 0.76–1.27)
GFR calc Af Amer: 53 mL/min/{1.73_m2} — ABNORMAL LOW (ref >59)
GFR calc non Af Amer: 46 mL/min/{1.73_m2} — ABNORMAL LOW (ref >59)
Glucose: 93 mg/dL (ref 65–99)
Potassium: 4.6 mmol/L (ref 3.5–5.2)
Sodium: 136 mmol/L (ref 134–144)

## 2019-12-06 LAB — CBC
Hematocrit: 38.1 % (ref 37.5–51.0)
Hemoglobin: 12.6 g/dL — ABNORMAL LOW (ref 13.0–17.7)
MCH: 30.3 pg (ref 26.6–33.0)
MCHC: 33.1 g/dL (ref 31.5–35.7)
MCV: 92 fL (ref 79–97)
Platelets: 220 10*3/uL (ref 150–450)
RBC: 4.16 x10E6/uL (ref 4.14–5.80)
RDW: 11.8 % (ref 11.6–15.4)
WBC: 5.6 10*3/uL (ref 3.4–10.8)

## 2019-12-16 IMAGING — MR MR LUMBAR SPINE W/O CM
5 series · 47 of 48 positions shown · non-contrast
Comparison: None.

CLINICAL DATA: Left-sided low back pain.  Radicular leg pain.

EXAM:
MRI LUMBAR SPINE WITHOUT CONTRAST
TECHNIQUE: Multiplanar, multisequence MR imaging of the lumbar spine was
performed. No intravenous contrast was administered.

[Series 3: T2 post-contrast · sagittal · 4.0mm · 0.88mm/px · 6 of 13 slices shown]
[im 1/13]
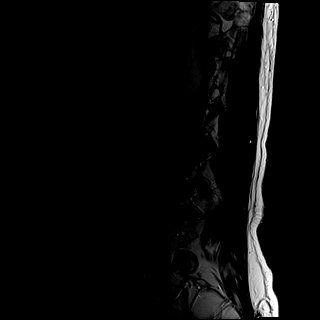
[im 3/13]
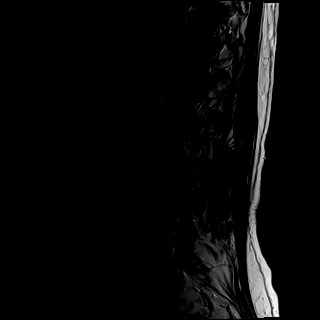
[im 5/13]
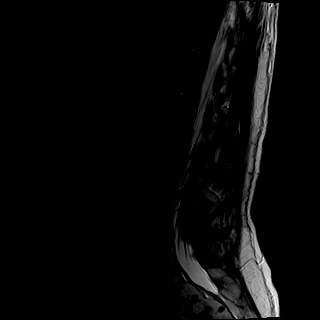
[im 8/13]
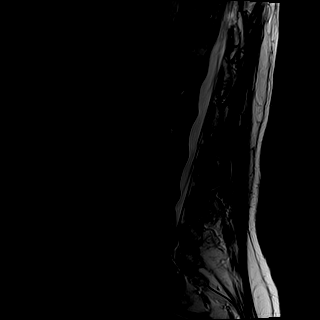
[im 10/13]
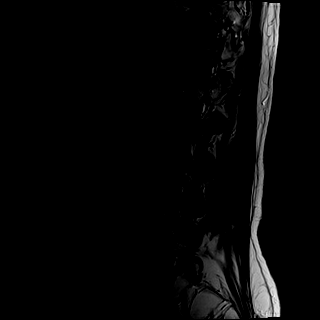
[im 13/13]
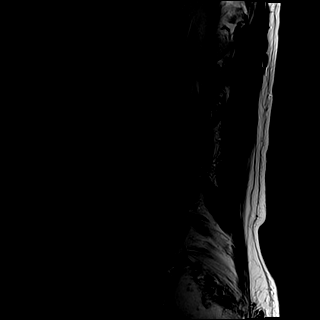

[Series 4: T1 · sagittal · 4.0mm · 0.88mm/px · 5 of 13 slices shown (1 of 2)]
[im 1/13]
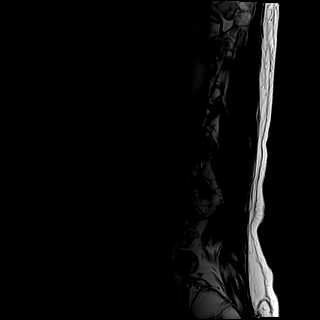
[im 4/13]
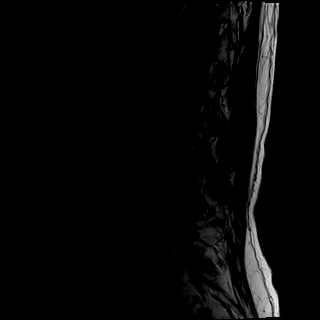
[im 7/13]
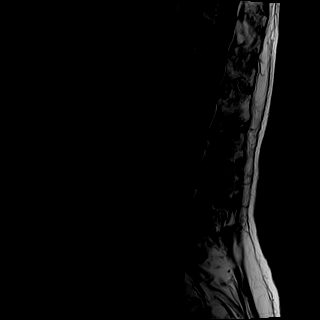
[im 10/13]
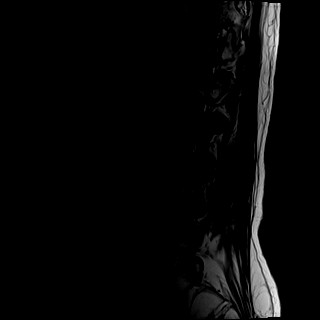
[im 13/13]
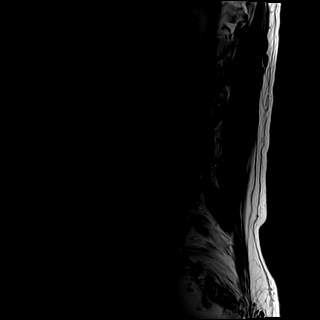

[Series 5: tirm sag · sagittal · 4.0mm · 0.55mm/px · 5 of 13 slices shown]
[im 1/13]
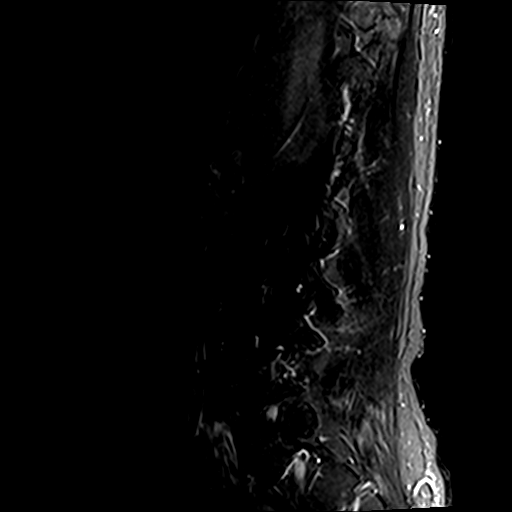
[im 4/13]
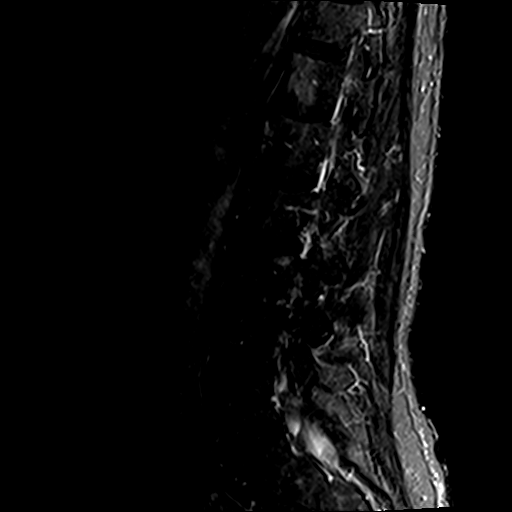
[im 7/13]
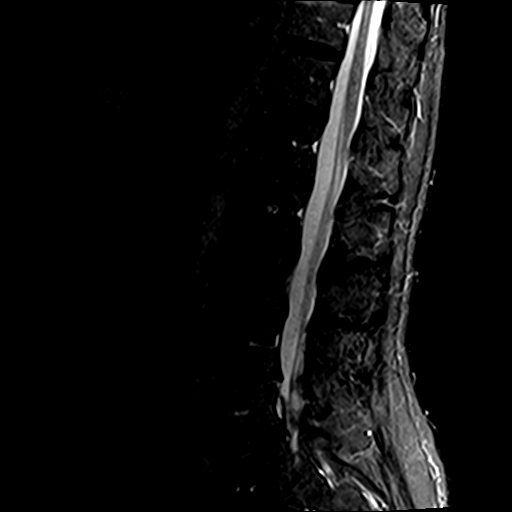
[im 10/13]
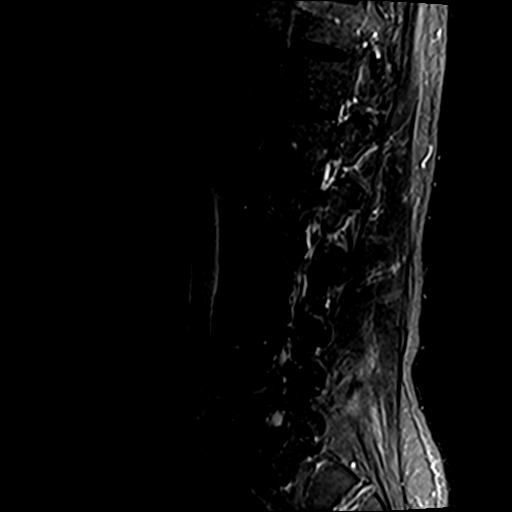
[im 13/13]
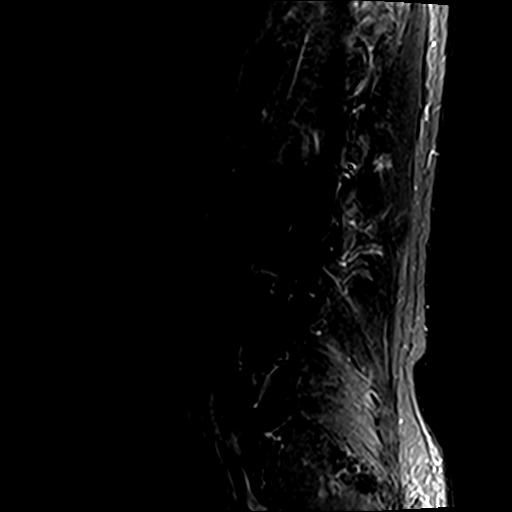

[Series 6: T1 · axial · 4.0mm · 0.78mm/px · z∈[-46,+176]mm · 15 of 40 slices shown (2 of 2)]
[im 1/40]
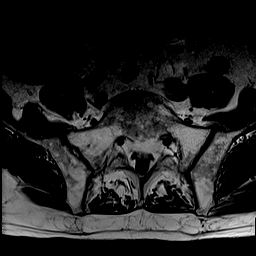
[im 3/40]
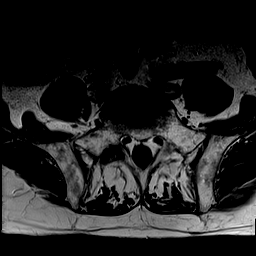
[im 6/40]
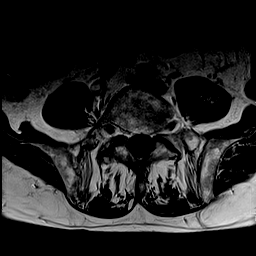
[im 8/40]
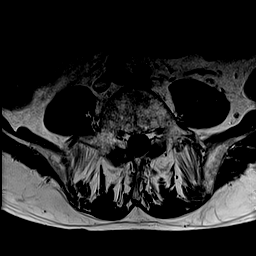
[im 11/40]
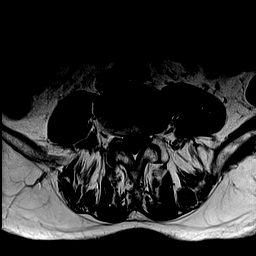
[im 14/40]
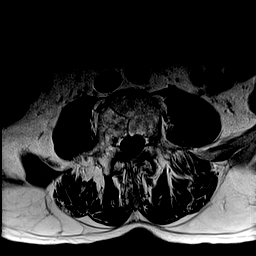
[im 16/40]
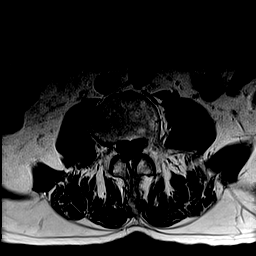
[im 19/40]
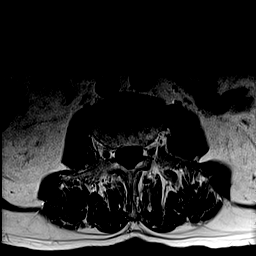
[im 21/40]
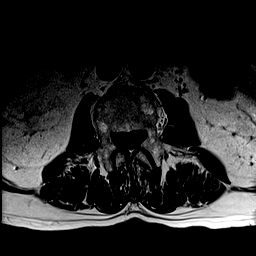
[im 24/40]
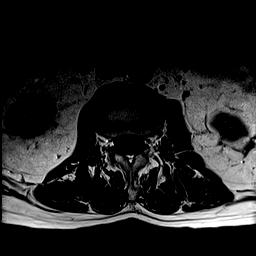
[im 27/40]
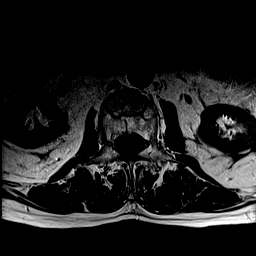
[im 29/40]
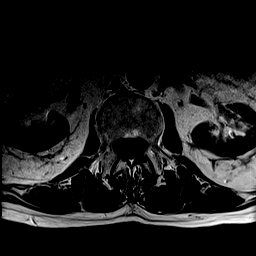
[im 32/40]
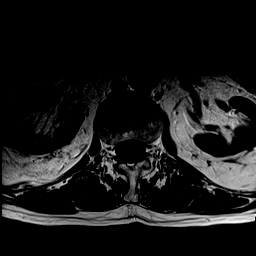
[im 34/40]
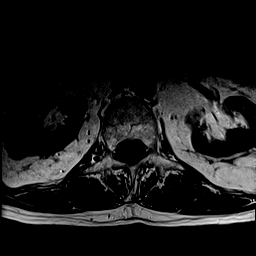
[im 40/40]
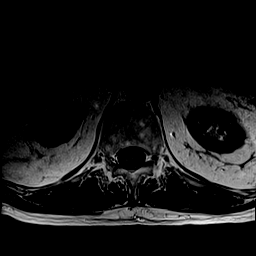

[Series 7: T2 · axial · 4.0mm · 0.78mm/px · z∈[-46,+176]mm · 16 of 40 slices shown]
[im 1/40]
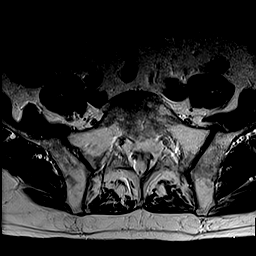
[im 3/40]
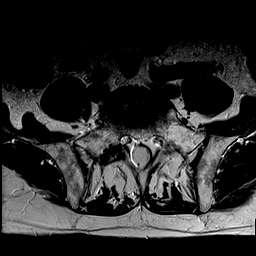
[im 6/40]
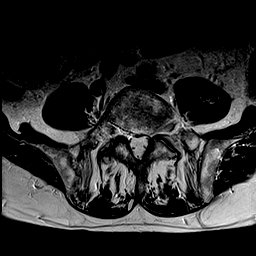
[im 8/40]
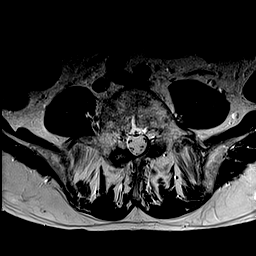
[im 11/40]
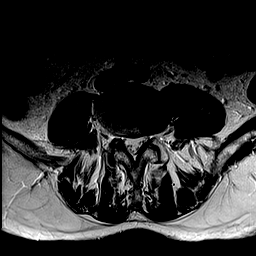
[im 14/40]
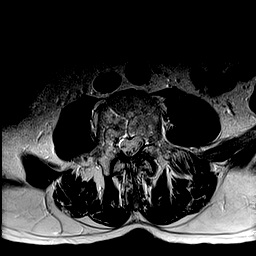
[im 16/40]
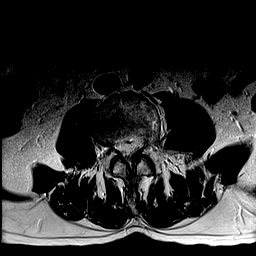
[im 19/40]
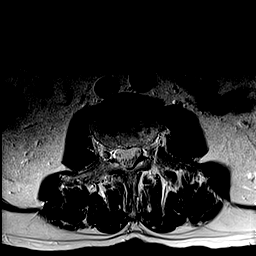
[im 21/40]
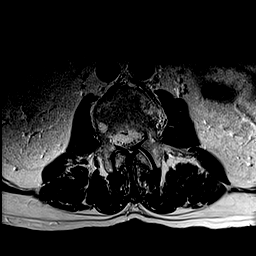
[im 24/40]
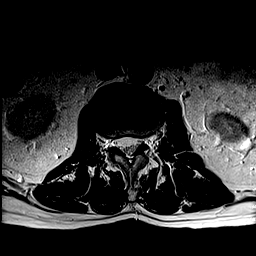
[im 27/40]
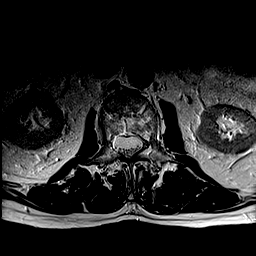
[im 29/40]
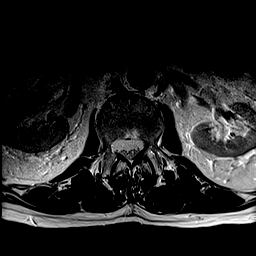
[im 32/40]
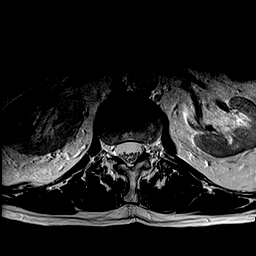
[im 34/40]
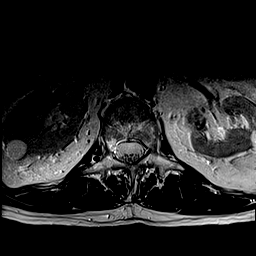
[im 37/40]
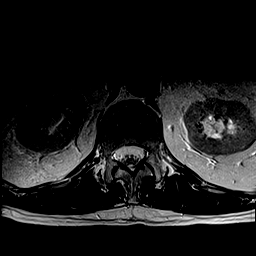
[im 40/40]
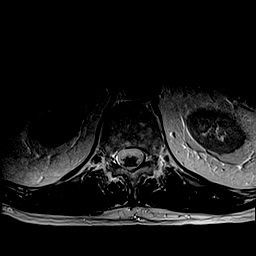

[47 of 48 positions shown; findings below may reference images not displayed]

FINDINGS: Segmentation:  Standard based on lumbar radiography 05/12/2017

Alignment:  Slight dextrocurvature.  Slight anterolisthesis at L2-3.

Vertebrae:  No fracture, evidence of discitis, or bone lesion.

Conus medullaris and cauda equina: Conus extends to the L1 level.
Conus and cauda equina appear normal.

Paraspinal and other soft tissues: Bilateral renal cystic
intensities.

Disc levels:

T12- L1: Unremarkable.

L1-L2: Unremarkable.

L2-L3: Slight anterolisthesis without notable facet degenerative
change. Mild disc bulging and ventral thecal sac flattening.

L3-L4: Disc narrowing asymmetric towards the left where there is
endplate ridging and predominately far-lateral bulging. Mild
posterior element hypertrophy. Although left subarticular recess
narrowing is mild there appears to be impingement on the descending
L4 nerve root. Patent foramina

L4-L5: Mild disc narrowing and bulging. Mild right-sided facet
hypertrophy. No compressive stenosis.

L5-S1:Minor disc narrowing and right facet spurring.
IMPRESSION: 1. Overall mild degenerative changes for age.
2. L3-4 left subarticular recess narrowing that is mild but could
affect the descending L4 nerve root. No other impingement noted on
the symptomatic left side.
3. L4-5 mild right subarticular recess narrowing.

## 2019-12-18 ENCOUNTER — Telehealth: Payer: Self-pay | Admitting: Physician Assistant

## 2019-12-18 NOTE — Telephone Encounter (Signed)
Pt c/o BP issue: STAT if pt c/o blurred vision, one-sided weakness or slurred speech  1. What are your last 5 BP readings?  08/12 144/75 HR 58 08/13 136/80 HR 66 08/14 AM 150/74 HR 63 PM 174/84 HR 85 08/15 AM 150/74 HR 60 PM 156/77 HR 63 08/16 153/78 HR 59 08/17 AM 153/70 HR 60  08/18 144/71 HR 61 08/19 AM 171/77 HR 59 PM 132/79 HR 65 08/20 AM 152/71 HR 61 PM 149/69 HR 60 08/21 PM 152/71 HR 63 08/22 AM 148/74 HR 59  08/23 154/75 HR [id number]  2. Are you having any other symptoms (ex. Dizziness, headache, blurred vision, passed out)? No  3. What is your BP issue? [facility] is sending in BP reading per Estella Husk Request.

## 2019-12-19 MED ORDER — AMLODIPINE BESYLATE 5 MG PO TABS: 2.5000 mg | ORAL_TABLET | Freq: Every day | ORAL | 1 refills | Status: DC

## 2019-12-19 NOTE — Telephone Encounter (Signed)
Spoke to pt. Sent in Amlodipine to [facility]. Pt will send in BP readings in 2 weeks.

## 2019-12-19 NOTE — Telephone Encounter (Signed)
BP's still running high. Let's try low dose amlodipine 5 mg 1/2 tablet daily. Keep track of BP for 2 weeks and send to me. thanks

## 2020-01-03 ENCOUNTER — Telehealth: Payer: Self-pay | Admitting: Cardiology

## 2020-01-03 NOTE — Telephone Encounter (Signed)
Called number listed for patient and the phone continued to ring with no answer or answering machine. Will attempt to call patient again later

## 2020-01-03 NOTE — Telephone Encounter (Signed)
New message:    Patient calling to report his BP. Please call patient back.

## 2020-01-03 NOTE — Telephone Encounter (Signed)
Returned call to get his readings: started new medication 8/28  8/28 am 125/75  66  8/28 pm 144/77  66  8/29 am  145/75  69   8/30 am 141/78  61  8/31 am 143/67  63 8/31 pm  151/78  55  9/02 am 139/83  69  9/03 pm  142/70  79  9/04 noon 140/78   77  9/05 am  141/81  76  9/06  137/83  80  9/08 137/75  64   If he is going to stay on this medication he would like a written prescription mailed to the [facility]:  [facility] [place] Atn:  Dr. Domenica Fail  He has a weeks worth of medication left.  I let him know would forward to Ermalinda Barrios to review his readings and we will call him back with recommendations.

## 2020-01-04 NOTE — Telephone Encounter (Signed)
BP stable. Please ask him which Rx he needs sent to [place]. Ok to send

## 2020-01-08 ENCOUNTER — Other Ambulatory Visit: Payer: Self-pay | Admitting: Cardiology

## 2020-01-08 MED ORDER — AMLODIPINE BESYLATE 5 MG PO TABS: 2.5000 mg | ORAL_TABLET | Freq: Every day | ORAL | 3 refills | Status: DC

## 2020-01-08 NOTE — Telephone Encounter (Signed)
New Message     *STAT* If patient is at the pharmacy, call can be transferred to refill team.   1. Which medications need to be refilled? (please list name of each medication and dose if known) amLODipine (NORVASC) 5 MG tablet   2. Which pharmacy/location (including street and city if local pharmacy) is medication to be sent to? [facility] [place] Atn:  Dr. Domenica Fail  3. Do they need a 30 day or 90 day supply? [place]

## 2020-01-08 NOTE — Telephone Encounter (Signed)
Pt's medication was sent to pt's pharmacy as requested. Confirmation received.  °

## 2020-01-11 NOTE — Telephone Encounter (Signed)
Refill for Amlodipine was sent in on 9/14 to [facility]. Pt aware.

## 2020-01-17 MED ORDER — AMLODIPINE BESYLATE 5 MG PO TABS: 2.5000 mg | ORAL_TABLET | Freq: Every day | ORAL | 3 refills | Status: DC

## 2020-01-17 NOTE — Telephone Encounter (Signed)
*  STAT* If patient is at the pharmacy, call can be transferred to refill team.   1. Which medications need to be refilled? (please list name of each medication and dose if known) amLODipine (NORVASC) 5 MG tablet  2. Which pharmacy/location (including street and city if local pharmacy) is medication to be sent to? [place], [place]  3. Do they need a 30 day or 90 day supply? 76  **Patient called and said that pharmacy did not receive prescription or refill. Please resend fax to [phone] per patient.**

## 2020-01-17 NOTE — Addendum Note (Signed)
Addended by: Carter Kitten D on: 01/17/2020 11:57 AM   Modules accepted: Orders

## 2020-01-17 NOTE — Telephone Encounter (Signed)
Pt's medication was faxed to the [place] at [phone]. Confirmation received.

## 2020-01-24 ENCOUNTER — Telehealth: Payer: Self-pay

## 2020-01-24 NOTE — Telephone Encounter (Signed)
Notes on file from [place] placed notes in dr turner's box

## 2020-04-30 DIAGNOSIS — L720 Epidermal cyst: Secondary | ICD-10-CM | POA: Diagnosis not present

## 2020-04-30 DIAGNOSIS — Z1283 Encounter for screening for malignant neoplasm of skin: Secondary | ICD-10-CM | POA: Diagnosis not present

## 2020-04-30 DIAGNOSIS — X32XXXD Exposure to sunlight, subsequent encounter: Secondary | ICD-10-CM | POA: Diagnosis not present

## 2020-04-30 DIAGNOSIS — L570 Actinic keratosis: Secondary | ICD-10-CM | POA: Diagnosis not present

## 2020-06-03 ENCOUNTER — Ambulatory Visit: Payer: Medicare Other | Admitting: Cardiology

## 2020-06-12 ENCOUNTER — Encounter: Payer: Self-pay | Admitting: Cardiology

## 2020-06-12 ENCOUNTER — Ambulatory Visit (INDEPENDENT_AMBULATORY_CARE_PROVIDER_SITE_OTHER): Payer: Medicare Other | Admitting: Cardiology

## 2020-06-12 VITALS — BP 130/82 | HR 66 | Ht 68.5 in | Wt 174.2 lb

## 2020-06-12 DIAGNOSIS — I480 Paroxysmal atrial fibrillation: Secondary | ICD-10-CM

## 2020-06-12 DIAGNOSIS — E785 Hyperlipidemia, unspecified: Secondary | ICD-10-CM

## 2020-06-12 DIAGNOSIS — I2510 Atherosclerotic heart disease of native coronary artery without angina pectoris: Secondary | ICD-10-CM

## 2020-06-12 DIAGNOSIS — I10 Essential (primary) hypertension: Secondary | ICD-10-CM | POA: Diagnosis not present

## 2020-06-12 NOTE — Patient Instructions (Signed)

## 2020-06-12 NOTE — Progress Notes (Signed)
Cardiology Office Note:    Date:  06/12/2020   ID:  George Cain, DOB 11-29-1929, MRN [id number]  PCP:  [name], MD  Cardiologist:  Fransico Him, MD    Referring MD: [name], MD   Chief Complaint  Patient presents with  . Coronary Artery Disease  . Hyperlipidemia  . Atrial Fibrillation    History of Present Illness:    George Cain is a 87 y.o. male with a hx of PAF s/p RFA 07/2012,nonobstructive ASCAD with 20% LAD by cath 2011, HTNand dyslipidemia. He has declined anticoagulation in the past. He was seen by Dr. Caryl Comes due to bradycardia on Sotolol and the sotolol was stopped. He continued to refuse anticoagulation. He started to have palpitations again and he restarted Sotolol at 40mg  weekly and eventually went on Eliquis 5mg  BID.  He saw Estella Husk, PA this summer and BP was elevated and he was started on low dose amlodipine.   He is here today for followup and is doing well.  He denies any chest pain or pressure, SOB, DOE, PND, orthopnea, LE edema, dizziness, palpitations or syncope. He is compliant with his meds and is tolerating meds with no SE.    Past Medical History:  Diagnosis Date  . Allergic rhinitis   . Benign essential HTN   . BPH (benign prostatic hypertrophy)   . Coronary artery disease 2011   nonobstructive ASCAD with 20% LAD s  . Hyperlipidemia    Patient refused statins  . PAF (paroxysmal atrial fibrillation) (HCC)    CHADS2VASC score 3 on Eliquis     Past Surgical History:  Procedure Laterality Date  . CARDIAC CATHETERIZATION  2011   Nonobstructive ASCAD w 20% LAD  . COLONOSCOPY  06/15/2010  . RADIOFREQUENCY ABLATION  08/23/12  . TONSILLECTOMY  1940   as child    Current Medications: Current Meds  Medication Sig  . acetaminophen (TYLENOL) 500 MG tablet Take 500 mg by mouth 2 (two) times daily.  Marland Kitchen amLODipine (NORVASC) 5 MG tablet Take 0.5 tablets (2.5 mg total) by mouth daily.  Marland Kitchen apixaban (ELIQUIS) 5 MG TABS tablet Take 1 tablet  (5 mg total) by mouth 2 (two) times daily.  . B Complex-C (SUPER B COMPLEX/VITAMIN C) TABS Take 1 tablet by mouth daily.  . cetirizine (ZYRTEC) 10 MG tablet Take 10 mg by mouth daily.   Marland Kitchen docusate calcium (SURFAK) 240 MG capsule Take 1 capsule by mouth daily.  . Ferrous Sulfate (IRON) 325 (65 Fe) MG TABS 1 tablet  . finasteride (PROSCAR) 5 MG tablet Take 5 mg by mouth daily.  . fluticasone (FLONASE) 50 MCG/ACT nasal spray Place 2 sprays into both nostrils daily.  Marland Kitchen ipratropium (ATROVENT HFA) 17 MCG/ACT inhaler Inhale 2 puffs into the lungs as directed.  Marland Kitchen ipratropium (ATROVENT) 0.03 % nasal spray SPRAY 2 SPRAYS INTO EACH NOSTRIL THREE TIMES A DAY AS NEEDED  . magnesium oxide (MAG-OX) 400 MG tablet Take 400 mg by mouth daily.  . metroNIDAZOLE (METROCREAM) 0.75 % cream APPLY SMALL AMOUNT TO AFFECTED AREA DAILY  . Multiple Vitamin (MULTI-VITAMINS) TABS Take by mouth daily.   . Multiple Vitamins-Minerals (ICAPS AREDS 2 PO) Take by mouth daily.  Marland Kitchen omeprazole (PRILOSEC) 20 MG capsule Take 1 capsule by mouth daily.  . polyethylene glycol (MIRALAX / GLYCOLAX) packet Take 17 g by mouth daily as needed for moderate constipation.  . potassium gluconate 595 (99 K) MG TABS tablet 1 tablet  . sotalol (BETAPACE) 80 MG tablet Take 0.5 tablets (  40 mg total) by mouth once a week.     Allergies:   Keflex [cephalexin]   Social History   Socioeconomic History  . Marital status: Married    Spouse name: Not on file  . Number of children: Not on file  . Years of education: Not on file  . Highest education level: Not on file  Occupational History  . Not on file  Tobacco Use  . Smoking status: Never Smoker  . Smokeless tobacco: Never Used  Vaping Use  . Vaping Use: Never used  Substance and Sexual Activity  . Alcohol use: No    Alcohol/week: 0.0 standard drinks  . Drug use: No  . Sexual activity: Not on file  Other Topics Concern  . Not on file  Social History Narrative  . Not on file   Social  Determinants of Health   Financial Resource Strain: Not on file  Food Insecurity: Not on file  Transportation Needs: Not on file  Physical Activity: Not on file  Stress: Not on file  Social Connections: Not on file     Family History: The patient's family history includes CAD in his father; Diabetes in his father; Heart attack in his father; Hypertension in his mother; Prostate cancer in his father.  ROS:   Please see the history of present illness.    ROS  All other systems reviewed and negative.   EKGs/Labs/Other Studies Reviewed:    The following studies were reviewed today: Outside labs on [facility]  EKG:  EKG is not ordered today.    Recent Labs: 12/05/2019: BUN 22; Creatinine, Ser 1.36; Hemoglobin 12.6; Platelets 220; Potassium 4.6; Sodium 136   Recent Lipid Panel    Component Value Date/Time   CHOL 176 09/25/2015 1011   TRIG 111 09/25/2015 1011   HDL 68 09/25/2015 1011   CHOLHDL 2.6 09/25/2015 1011   VLDL 22 09/25/2015 1011   LDLCALC 86 09/25/2015 1011    Physical Exam:    VS:  BP 130/82   Pulse 66   Ht 5' 8.5" (1.74 m)   Wt 174 lb 3.2 oz (79 kg)   SpO2 99%   BMI 26.10 kg/m     Wt Readings from Last 3 Encounters:  06/12/20 174 lb 3.2 oz (79 kg)  12/05/19 174 lb 9.6 oz (79.2 kg)  06/07/19 176 lb 3.2 oz (79.9 kg)     GEN: Well nourished, well developed in no acute distress HEENT: Normal NECK: No JVD; No carotid bruits LYMPHATICS: No lymphadenopathy CARDIAC:RRR, no murmurs, rubs, gallops RESPIRATORY:  Clear to auscultation without rales, wheezing or rhonchi  ABDOMEN: Soft, non-tender, non-distended MUSCULOSKELETAL:  No edema; No deformity  SKIN: Warm and dry NEUROLOGIC:  Alert and oriented x 3 PSYCHIATRIC:  Normal affect    ASSESSMENT:    1. Coronary artery disease involving native coronary artery of native heart without angina pectoris   2. PAF (paroxysmal atrial fibrillation) ([place])   3. Dyslipidemia   4. Benign essential HTN    PLAN:    In  order of problems listed above:  1.  ASCAD -nonobstructive ASCAD with 20% LAD by cath 2011.   -he has not had any anginal sx since I saw him last -no ASA due to DOAC -he has refused statin therapy  2.  PAF  -he is maintaining NSR on exam today with no palpitations -continue  Eliquis 5mg  BID -he denies any significant bleeding issues -QTc 466ms on EG 11/2019 -blood work at [place] from 06/02/2020 was  reviewed and showed a Hbg of 11.9 and SCR 1.38  3.  HLD -LDL goal is < 70 but he has refused statin therapy  4.  HTN -He brings in BP readings today which are fairly well controlled -continue amlodipine 2.5mg  daily  Followup with me in 1 year  Medication Adjustments/Labs and Tests Ordered: Current medicines are reviewed at length with the patient today.  Concerns regarding medicines are outlined above.  No orders of the defined types were placed in this encounter.  No orders of the defined types were placed in this encounter.   Signed, Fransico Him, MD  06/12/2020 10:50 AM    [place]

## 2020-07-04 DIAGNOSIS — M6289 Other specified disorders of muscle: Secondary | ICD-10-CM | POA: Diagnosis not present

## 2020-07-04 DIAGNOSIS — M62838 Other muscle spasm: Secondary | ICD-10-CM | POA: Diagnosis not present

## 2020-07-04 DIAGNOSIS — M47896 Other spondylosis, lumbar region: Secondary | ICD-10-CM | POA: Diagnosis not present

## 2020-07-22 DIAGNOSIS — M62838 Other muscle spasm: Secondary | ICD-10-CM | POA: Diagnosis not present

## 2020-07-22 DIAGNOSIS — M47896 Other spondylosis, lumbar region: Secondary | ICD-10-CM | POA: Diagnosis not present

## 2020-07-22 DIAGNOSIS — M6289 Other specified disorders of muscle: Secondary | ICD-10-CM | POA: Diagnosis not present

## 2020-07-24 DIAGNOSIS — M47896 Other spondylosis, lumbar region: Secondary | ICD-10-CM | POA: Diagnosis not present

## 2020-07-24 DIAGNOSIS — M62838 Other muscle spasm: Secondary | ICD-10-CM | POA: Diagnosis not present

## 2020-07-24 DIAGNOSIS — M6289 Other specified disorders of muscle: Secondary | ICD-10-CM | POA: Diagnosis not present

## 2020-07-29 DIAGNOSIS — M62838 Other muscle spasm: Secondary | ICD-10-CM | POA: Diagnosis not present

## 2020-07-29 DIAGNOSIS — M6289 Other specified disorders of muscle: Secondary | ICD-10-CM | POA: Diagnosis not present

## 2020-07-29 DIAGNOSIS — M47896 Other spondylosis, lumbar region: Secondary | ICD-10-CM | POA: Diagnosis not present

## 2020-08-01 DIAGNOSIS — M47896 Other spondylosis, lumbar region: Secondary | ICD-10-CM | POA: Diagnosis not present

## 2020-08-01 DIAGNOSIS — M6289 Other specified disorders of muscle: Secondary | ICD-10-CM | POA: Diagnosis not present

## 2020-08-01 DIAGNOSIS — M62838 Other muscle spasm: Secondary | ICD-10-CM | POA: Diagnosis not present

## 2020-08-05 DIAGNOSIS — M47896 Other spondylosis, lumbar region: Secondary | ICD-10-CM | POA: Diagnosis not present

## 2020-08-05 DIAGNOSIS — M6289 Other specified disorders of muscle: Secondary | ICD-10-CM | POA: Diagnosis not present

## 2020-08-05 DIAGNOSIS — M62838 Other muscle spasm: Secondary | ICD-10-CM | POA: Diagnosis not present

## 2020-08-07 DIAGNOSIS — M62838 Other muscle spasm: Secondary | ICD-10-CM | POA: Diagnosis not present

## 2020-08-07 DIAGNOSIS — M6289 Other specified disorders of muscle: Secondary | ICD-10-CM | POA: Diagnosis not present

## 2020-08-07 DIAGNOSIS — M47896 Other spondylosis, lumbar region: Secondary | ICD-10-CM | POA: Diagnosis not present

## 2020-08-13 DIAGNOSIS — M6289 Other specified disorders of muscle: Secondary | ICD-10-CM | POA: Diagnosis not present

## 2020-08-13 DIAGNOSIS — M47896 Other spondylosis, lumbar region: Secondary | ICD-10-CM | POA: Diagnosis not present

## 2020-08-13 DIAGNOSIS — M62838 Other muscle spasm: Secondary | ICD-10-CM | POA: Diagnosis not present

## 2020-08-15 DIAGNOSIS — M62838 Other muscle spasm: Secondary | ICD-10-CM | POA: Diagnosis not present

## 2020-08-15 DIAGNOSIS — M47896 Other spondylosis, lumbar region: Secondary | ICD-10-CM | POA: Diagnosis not present

## 2020-08-15 DIAGNOSIS — M6289 Other specified disorders of muscle: Secondary | ICD-10-CM | POA: Diagnosis not present

## 2020-08-19 DIAGNOSIS — M47896 Other spondylosis, lumbar region: Secondary | ICD-10-CM | POA: Diagnosis not present

## 2020-08-19 DIAGNOSIS — M6289 Other specified disorders of muscle: Secondary | ICD-10-CM | POA: Diagnosis not present

## 2020-08-19 DIAGNOSIS — M62838 Other muscle spasm: Secondary | ICD-10-CM | POA: Diagnosis not present

## 2020-08-20 DIAGNOSIS — D6869 Other thrombophilia: Secondary | ICD-10-CM | POA: Diagnosis not present

## 2020-08-20 DIAGNOSIS — Z0000 Encounter for general adult medical examination without abnormal findings: Secondary | ICD-10-CM | POA: Diagnosis not present

## 2020-08-20 DIAGNOSIS — J309 Allergic rhinitis, unspecified: Secondary | ICD-10-CM | POA: Diagnosis not present

## 2020-08-20 DIAGNOSIS — I10 Essential (primary) hypertension: Secondary | ICD-10-CM | POA: Diagnosis not present

## 2020-08-20 DIAGNOSIS — I480 Paroxysmal atrial fibrillation: Secondary | ICD-10-CM | POA: Diagnosis not present

## 2020-08-20 DIAGNOSIS — M47812 Spondylosis without myelopathy or radiculopathy, cervical region: Secondary | ICD-10-CM | POA: Diagnosis not present

## 2020-08-20 DIAGNOSIS — Z1389 Encounter for screening for other disorder: Secondary | ICD-10-CM | POA: Diagnosis not present

## 2020-08-20 DIAGNOSIS — N400 Enlarged prostate without lower urinary tract symptoms: Secondary | ICD-10-CM | POA: Diagnosis not present

## 2020-08-20 DIAGNOSIS — I2510 Atherosclerotic heart disease of native coronary artery without angina pectoris: Secondary | ICD-10-CM | POA: Diagnosis not present

## 2020-08-20 DIAGNOSIS — N1831 Chronic kidney disease, stage 3a: Secondary | ICD-10-CM | POA: Diagnosis not present

## 2020-08-22 DIAGNOSIS — M62838 Other muscle spasm: Secondary | ICD-10-CM | POA: Diagnosis not present

## 2020-08-22 DIAGNOSIS — M6289 Other specified disorders of muscle: Secondary | ICD-10-CM | POA: Diagnosis not present

## 2020-08-22 DIAGNOSIS — M47896 Other spondylosis, lumbar region: Secondary | ICD-10-CM | POA: Diagnosis not present

## 2020-08-26 DIAGNOSIS — M9901 Segmental and somatic dysfunction of cervical region: Secondary | ICD-10-CM | POA: Diagnosis not present

## 2020-08-26 DIAGNOSIS — M542 Cervicalgia: Secondary | ICD-10-CM | POA: Diagnosis not present

## 2020-08-26 DIAGNOSIS — M9903 Segmental and somatic dysfunction of lumbar region: Secondary | ICD-10-CM | POA: Diagnosis not present

## 2020-08-26 DIAGNOSIS — M9904 Segmental and somatic dysfunction of sacral region: Secondary | ICD-10-CM | POA: Diagnosis not present

## 2020-09-03 DIAGNOSIS — M6289 Other specified disorders of muscle: Secondary | ICD-10-CM | POA: Diagnosis not present

## 2020-09-03 DIAGNOSIS — M62838 Other muscle spasm: Secondary | ICD-10-CM | POA: Diagnosis not present

## 2020-09-03 DIAGNOSIS — M47896 Other spondylosis, lumbar region: Secondary | ICD-10-CM | POA: Diagnosis not present

## 2020-09-25 DIAGNOSIS — M6289 Other specified disorders of muscle: Secondary | ICD-10-CM | POA: Diagnosis not present

## 2020-09-25 DIAGNOSIS — M47896 Other spondylosis, lumbar region: Secondary | ICD-10-CM | POA: Diagnosis not present

## 2020-09-25 DIAGNOSIS — M62838 Other muscle spasm: Secondary | ICD-10-CM | POA: Diagnosis not present

## 2020-09-30 DIAGNOSIS — H318 Other specified disorders of choroid: Secondary | ICD-10-CM | POA: Diagnosis not present

## 2020-09-30 DIAGNOSIS — H26492 Other secondary cataract, left eye: Secondary | ICD-10-CM | POA: Diagnosis not present

## 2020-09-30 DIAGNOSIS — H35363 Drusen (degenerative) of macula, bilateral: Secondary | ICD-10-CM | POA: Diagnosis not present

## 2020-09-30 DIAGNOSIS — H35373 Puckering of macula, bilateral: Secondary | ICD-10-CM | POA: Diagnosis not present

## 2020-10-19 ENCOUNTER — Other Ambulatory Visit: Payer: Self-pay | Admitting: Physician Assistant

## 2020-12-15 DIAGNOSIS — M545 Low back pain, unspecified: Secondary | ICD-10-CM | POA: Diagnosis not present

## 2021-01-15 DIAGNOSIS — Z23 Encounter for immunization: Secondary | ICD-10-CM | POA: Diagnosis not present

## 2021-01-27 DIAGNOSIS — M25552 Pain in left hip: Secondary | ICD-10-CM | POA: Diagnosis not present

## 2021-01-27 DIAGNOSIS — N1831 Chronic kidney disease, stage 3a: Secondary | ICD-10-CM | POA: Diagnosis not present

## 2021-01-27 DIAGNOSIS — M7918 Myalgia, other site: Secondary | ICD-10-CM | POA: Diagnosis not present

## 2021-02-03 DIAGNOSIS — M7918 Myalgia, other site: Secondary | ICD-10-CM | POA: Diagnosis not present

## 2021-02-03 DIAGNOSIS — R293 Abnormal posture: Secondary | ICD-10-CM | POA: Diagnosis not present

## 2021-02-03 DIAGNOSIS — M5459 Other low back pain: Secondary | ICD-10-CM | POA: Diagnosis not present

## 2021-02-03 DIAGNOSIS — M25552 Pain in left hip: Secondary | ICD-10-CM | POA: Diagnosis not present

## 2021-02-05 DIAGNOSIS — R293 Abnormal posture: Secondary | ICD-10-CM | POA: Diagnosis not present

## 2021-02-05 DIAGNOSIS — M25552 Pain in left hip: Secondary | ICD-10-CM | POA: Diagnosis not present

## 2021-02-05 DIAGNOSIS — M5459 Other low back pain: Secondary | ICD-10-CM | POA: Diagnosis not present

## 2021-02-05 DIAGNOSIS — M7918 Myalgia, other site: Secondary | ICD-10-CM | POA: Diagnosis not present

## 2021-02-06 ENCOUNTER — Telehealth: Payer: Self-pay | Admitting: Cardiology

## 2021-02-06 NOTE — Telephone Encounter (Signed)
Spoke with pt who states he faxed over some lab work and a note to Dr Radford Pax about adjusting Eliquis dosage.  Pt advised after checking Dr Theodosia Blender mailbox RN does not currently see a fax.  Pt confirms he faxed information to [phone].  Pt advised if it was faxed 2 days ago Dr Radford Pax or her nurse could possibly have the information.  Will forward to Dr Radford Pax and her RN to address when they return to the office.

## 2021-02-06 NOTE — Telephone Encounter (Signed)
Patient calling to speak to the nurse to see if he faxes that he sent over was received. Please advise

## 2021-02-09 NOTE — Telephone Encounter (Signed)
Advised patient that we have not received that fax. He will resend.

## 2021-02-09 NOTE — Telephone Encounter (Signed)
Fax received, placed in Dr. Theodosia Blender folder for review.

## 2021-02-10 DIAGNOSIS — M5459 Other low back pain: Secondary | ICD-10-CM | POA: Diagnosis not present

## 2021-02-10 DIAGNOSIS — R293 Abnormal posture: Secondary | ICD-10-CM | POA: Diagnosis not present

## 2021-02-10 DIAGNOSIS — M25552 Pain in left hip: Secondary | ICD-10-CM | POA: Diagnosis not present

## 2021-02-10 DIAGNOSIS — M7918 Myalgia, other site: Secondary | ICD-10-CM | POA: Diagnosis not present

## 2021-02-10 NOTE — Telephone Encounter (Signed)
Patient called to check on status.  I advised him fax was received and it's in Dr. Theodosia Blender folder for review.

## 2021-02-12 DIAGNOSIS — R293 Abnormal posture: Secondary | ICD-10-CM | POA: Diagnosis not present

## 2021-02-12 DIAGNOSIS — M7918 Myalgia, other site: Secondary | ICD-10-CM | POA: Diagnosis not present

## 2021-02-12 DIAGNOSIS — M25552 Pain in left hip: Secondary | ICD-10-CM | POA: Diagnosis not present

## 2021-02-12 DIAGNOSIS — M5459 Other low back pain: Secondary | ICD-10-CM | POA: Diagnosis not present

## 2021-02-17 DIAGNOSIS — R293 Abnormal posture: Secondary | ICD-10-CM | POA: Diagnosis not present

## 2021-02-17 DIAGNOSIS — M5459 Other low back pain: Secondary | ICD-10-CM | POA: Diagnosis not present

## 2021-02-17 DIAGNOSIS — M7918 Myalgia, other site: Secondary | ICD-10-CM | POA: Diagnosis not present

## 2021-02-17 DIAGNOSIS — M25552 Pain in left hip: Secondary | ICD-10-CM | POA: Diagnosis not present

## 2021-02-17 NOTE — Telephone Encounter (Signed)
    Pt is calling back to follow up  

## 2021-02-18 ENCOUNTER — Telehealth: Payer: Self-pay | Admitting: Cardiology

## 2021-02-18 NOTE — Telephone Encounter (Signed)
Follow Up:     Patient is  returning [place] call from today, concerning his results.

## 2021-02-18 NOTE — Telephone Encounter (Signed)
See previous phone note.  

## 2021-02-18 NOTE — Telephone Encounter (Signed)
Spoke with the patient and advised him that Dr. Radford Pax was in agreement for him to decrease his Eliquis to 2.5mg  twice daily. Patient verbalized understanding.

## 2021-02-18 NOTE — Telephone Encounter (Signed)
Left message for patient to call back  

## 2021-02-19 DIAGNOSIS — R293 Abnormal posture: Secondary | ICD-10-CM | POA: Diagnosis not present

## 2021-02-19 DIAGNOSIS — M25552 Pain in left hip: Secondary | ICD-10-CM | POA: Diagnosis not present

## 2021-02-19 DIAGNOSIS — M7918 Myalgia, other site: Secondary | ICD-10-CM | POA: Diagnosis not present

## 2021-02-19 DIAGNOSIS — M5459 Other low back pain: Secondary | ICD-10-CM | POA: Diagnosis not present

## 2021-02-24 DIAGNOSIS — M5459 Other low back pain: Secondary | ICD-10-CM | POA: Diagnosis not present

## 2021-02-24 DIAGNOSIS — M7918 Myalgia, other site: Secondary | ICD-10-CM | POA: Diagnosis not present

## 2021-02-24 DIAGNOSIS — R293 Abnormal posture: Secondary | ICD-10-CM | POA: Diagnosis not present

## 2021-02-24 DIAGNOSIS — M25552 Pain in left hip: Secondary | ICD-10-CM | POA: Diagnosis not present

## 2021-02-26 DIAGNOSIS — R293 Abnormal posture: Secondary | ICD-10-CM | POA: Diagnosis not present

## 2021-02-26 DIAGNOSIS — M25552 Pain in left hip: Secondary | ICD-10-CM | POA: Diagnosis not present

## 2021-02-26 DIAGNOSIS — M7918 Myalgia, other site: Secondary | ICD-10-CM | POA: Diagnosis not present

## 2021-02-26 DIAGNOSIS — M5459 Other low back pain: Secondary | ICD-10-CM | POA: Diagnosis not present

## 2021-03-03 DIAGNOSIS — R293 Abnormal posture: Secondary | ICD-10-CM | POA: Diagnosis not present

## 2021-03-03 DIAGNOSIS — M5459 Other low back pain: Secondary | ICD-10-CM | POA: Diagnosis not present

## 2021-03-03 DIAGNOSIS — M7918 Myalgia, other site: Secondary | ICD-10-CM | POA: Diagnosis not present

## 2021-03-03 DIAGNOSIS — M25552 Pain in left hip: Secondary | ICD-10-CM | POA: Diagnosis not present

## 2021-03-05 DIAGNOSIS — M5459 Other low back pain: Secondary | ICD-10-CM | POA: Diagnosis not present

## 2021-03-05 DIAGNOSIS — M7918 Myalgia, other site: Secondary | ICD-10-CM | POA: Diagnosis not present

## 2021-03-05 DIAGNOSIS — R293 Abnormal posture: Secondary | ICD-10-CM | POA: Diagnosis not present

## 2021-03-05 DIAGNOSIS — M25552 Pain in left hip: Secondary | ICD-10-CM | POA: Diagnosis not present

## 2021-03-11 DIAGNOSIS — M25552 Pain in left hip: Secondary | ICD-10-CM | POA: Diagnosis not present

## 2021-03-11 DIAGNOSIS — M7918 Myalgia, other site: Secondary | ICD-10-CM | POA: Diagnosis not present

## 2021-03-11 DIAGNOSIS — R293 Abnormal posture: Secondary | ICD-10-CM | POA: Diagnosis not present

## 2021-03-11 DIAGNOSIS — M5459 Other low back pain: Secondary | ICD-10-CM | POA: Diagnosis not present

## 2021-03-13 DIAGNOSIS — R293 Abnormal posture: Secondary | ICD-10-CM | POA: Diagnosis not present

## 2021-03-13 DIAGNOSIS — M25552 Pain in left hip: Secondary | ICD-10-CM | POA: Diagnosis not present

## 2021-03-13 DIAGNOSIS — M7918 Myalgia, other site: Secondary | ICD-10-CM | POA: Diagnosis not present

## 2021-03-13 DIAGNOSIS — M5459 Other low back pain: Secondary | ICD-10-CM | POA: Diagnosis not present

## 2021-03-25 DIAGNOSIS — M25552 Pain in left hip: Secondary | ICD-10-CM | POA: Diagnosis not present

## 2021-03-25 DIAGNOSIS — M5459 Other low back pain: Secondary | ICD-10-CM | POA: Diagnosis not present

## 2021-03-25 DIAGNOSIS — M7918 Myalgia, other site: Secondary | ICD-10-CM | POA: Diagnosis not present

## 2021-03-25 DIAGNOSIS — R293 Abnormal posture: Secondary | ICD-10-CM | POA: Diagnosis not present

## 2021-03-27 DIAGNOSIS — M25552 Pain in left hip: Secondary | ICD-10-CM | POA: Diagnosis not present

## 2021-03-27 DIAGNOSIS — R293 Abnormal posture: Secondary | ICD-10-CM | POA: Diagnosis not present

## 2021-03-27 DIAGNOSIS — M5459 Other low back pain: Secondary | ICD-10-CM | POA: Diagnosis not present

## 2021-03-27 DIAGNOSIS — M7918 Myalgia, other site: Secondary | ICD-10-CM | POA: Diagnosis not present

## 2021-04-01 DIAGNOSIS — M5459 Other low back pain: Secondary | ICD-10-CM | POA: Diagnosis not present

## 2021-04-01 DIAGNOSIS — R293 Abnormal posture: Secondary | ICD-10-CM | POA: Diagnosis not present

## 2021-04-01 DIAGNOSIS — M7918 Myalgia, other site: Secondary | ICD-10-CM | POA: Diagnosis not present

## 2021-04-01 DIAGNOSIS — M25552 Pain in left hip: Secondary | ICD-10-CM | POA: Diagnosis not present

## 2021-04-03 DIAGNOSIS — M7918 Myalgia, other site: Secondary | ICD-10-CM | POA: Diagnosis not present

## 2021-04-03 DIAGNOSIS — R293 Abnormal posture: Secondary | ICD-10-CM | POA: Diagnosis not present

## 2021-04-03 DIAGNOSIS — M5459 Other low back pain: Secondary | ICD-10-CM | POA: Diagnosis not present

## 2021-04-03 DIAGNOSIS — M25552 Pain in left hip: Secondary | ICD-10-CM | POA: Diagnosis not present

## 2021-04-08 DIAGNOSIS — M545 Low back pain, unspecified: Secondary | ICD-10-CM | POA: Diagnosis not present

## 2021-04-08 DIAGNOSIS — M25552 Pain in left hip: Secondary | ICD-10-CM | POA: Diagnosis not present

## 2021-04-24 DIAGNOSIS — M545 Low back pain, unspecified: Secondary | ICD-10-CM | POA: Diagnosis not present

## 2021-04-24 DIAGNOSIS — M25559 Pain in unspecified hip: Secondary | ICD-10-CM | POA: Diagnosis not present

## 2021-05-06 DIAGNOSIS — H35363 Drusen (degenerative) of macula, bilateral: Secondary | ICD-10-CM | POA: Diagnosis not present

## 2021-05-06 DIAGNOSIS — H35373 Puckering of macula, bilateral: Secondary | ICD-10-CM | POA: Diagnosis not present

## 2021-05-06 DIAGNOSIS — M545 Low back pain, unspecified: Secondary | ICD-10-CM | POA: Diagnosis not present

## 2021-05-06 DIAGNOSIS — H26491 Other secondary cataract, right eye: Secondary | ICD-10-CM | POA: Diagnosis not present

## 2021-05-06 DIAGNOSIS — H524 Presbyopia: Secondary | ICD-10-CM | POA: Diagnosis not present

## 2021-05-06 DIAGNOSIS — H318 Other specified disorders of choroid: Secondary | ICD-10-CM | POA: Diagnosis not present

## 2021-05-06 DIAGNOSIS — H02055 Trichiasis without entropian left lower eyelid: Secondary | ICD-10-CM | POA: Diagnosis not present

## 2021-05-06 DIAGNOSIS — Z961 Presence of intraocular lens: Secondary | ICD-10-CM | POA: Diagnosis not present

## 2021-05-06 DIAGNOSIS — H52203 Unspecified astigmatism, bilateral: Secondary | ICD-10-CM | POA: Diagnosis not present

## 2021-05-06 DIAGNOSIS — H5202 Hypermetropia, left eye: Secondary | ICD-10-CM | POA: Diagnosis not present

## 2021-05-11 DIAGNOSIS — M47816 Spondylosis without myelopathy or radiculopathy, lumbar region: Secondary | ICD-10-CM | POA: Diagnosis not present

## 2021-05-12 DIAGNOSIS — M9903 Segmental and somatic dysfunction of lumbar region: Secondary | ICD-10-CM | POA: Diagnosis not present

## 2021-05-12 DIAGNOSIS — M9901 Segmental and somatic dysfunction of cervical region: Secondary | ICD-10-CM | POA: Diagnosis not present

## 2021-05-12 DIAGNOSIS — M9904 Segmental and somatic dysfunction of sacral region: Secondary | ICD-10-CM | POA: Diagnosis not present

## 2021-05-12 DIAGNOSIS — M542 Cervicalgia: Secondary | ICD-10-CM | POA: Diagnosis not present

## 2021-05-18 ENCOUNTER — Telehealth: Payer: Self-pay | Admitting: Cardiology

## 2021-05-18 NOTE — Telephone Encounter (Signed)
° °  Pre-operative Risk Assessment    Patient Name: George Cain  DOB: [birth year] MRN: [id number]     Request for Surgical Clearance    Procedure:  Dental Extraction - Amount of Teeth to be Pulled:  1   Surgical Extraction of tooth number 30 w/ a sight preservation graft (please be aware that this procedure is a surgical extraction and will require a soft tissue flap to be elevated and bone removed)  Date of Surgery:  Clearance TBD                                 Surgeon:  Dr. Arnetha Courser Surgeon's Group or Practice Name:  [facility] Phone number:  [phone] Fax number:  [phone]   Type of Clearance Requested:   - Medical  - Pharmacy:  Hold Apixaban (Eliquis) 2 days prior    Type of Anesthesia:   IV   Additional requests/questions:   N/A  Signed, Kamira J Martinique   05/18/2021, 4:26 PM

## 2021-05-19 NOTE — Telephone Encounter (Signed)
Left VM

## 2021-05-19 NOTE — Telephone Encounter (Signed)
Patient with diagnosis of atrial fibrilation on Eliquis for anticoagulation.    Procedure: Dental Extraction - Amount of Teeth to be Pulled:  1   Surgical Extraction of tooth number 30 w/ a sight preservation graft (please be aware that this procedure is a surgical extraction and will require a soft tissue flap to be elevated and bone removed) Date of procedure: TBD   CHA2DS2-VASc Score = 4   This indicates a 4.8% annual risk of stroke. The patient's score is based upon: CHF History: 0 HTN History: 1 Diabetes History: 0 Stroke History: 0 Vascular Disease History: 1 Age Score: 2 Gender Score: 0    CrCl 34 Platelet count 216  Per office protocol, patient can hold Eliquis for 2 days prior to procedure.   Patient will not need bridging with Lovenox (enoxaparin) around procedure.

## 2021-05-20 ENCOUNTER — Encounter: Payer: Self-pay | Admitting: Cardiology

## 2021-05-20 NOTE — Telephone Encounter (Signed)
° °  Name: George Cain  DOB: 03-05-1930  MRN: [id number]   Primary Cardiologist: Fransico Him, MD  Chart reviewed as part of pre-operative protocol coverage. Patient was contacted 05/20/2021 in reference to pre-operative risk assessment for pending surgery as outlined below.  George Cain was last seen on 06/12/20 by Dr. Radford Pax.  Since that day, George Cain has done well. He exercises three times weekly for 45 min at a time. He can complete greater than 4.0 METS without angina.   Per our clinical pharmacist: Per office protocol, patient can hold Eliquis for 2 days prior to procedure.   Patient will not need bridging with Lovenox (enoxaparin) around procedure.  Therefore, based on ACC/AHA guidelines, the patient would be at acceptable risk for the planned procedure without further cardiovascular testing.   The patient was advised that if he develops new symptoms prior to surgery to contact our office to arrange for a follow-up visit, and he verbalized understanding.  I will route this recommendation to the requesting party via Epic fax function and remove from pre-op pool. Please call with questions.  Tami Lin Kathlyne Loud, PA 05/20/2021, 9:23 AM

## 2021-06-02 DIAGNOSIS — M47816 Spondylosis without myelopathy or radiculopathy, lumbar region: Secondary | ICD-10-CM | POA: Diagnosis not present

## 2021-06-18 ENCOUNTER — Telehealth: Payer: Self-pay | Admitting: Cardiology

## 2021-06-18 DIAGNOSIS — I480 Paroxysmal atrial fibrillation: Secondary | ICD-10-CM

## 2021-06-18 NOTE — Telephone Encounter (Signed)
Pt called again stating in the last 2 hours he has been feeling "that fluttering in my chest." Denies any symptoms of Cp, SOB, or lightheadedness. Pt just didn't know if he needed to let us know. Informed pt that we know he has a history of PAF and that he's already on medications for this, but to call if symptoms worsen or if he experiences any of the above. Pt understands.  States that he will be here tomorrow to bring his wife to have lab work and that if there's any blood work we need for him, that would be a good time to get it . Will route to covering RN as Juluis Rainier (no current orders in system)

## 2021-06-18 NOTE — Telephone Encounter (Signed)
Called patient back r/t his call to office about experiencing A-fib symptoms and wish to restart sotalol therapy. Pt states that on 2/20 his heart rate suddenly jumped to 140 while walking on the treadmill. After resting for approx 30 minutes, his heart rate "still felt high" but pt was unable to give specific rate. Pt states that prior to this, he had noticed a "flutter feeling in my shoulder"  that he says he now knows was his A-fib acting up. Since 2/20 pt says HR is "all over the place." Asked highest reading pt has seen and was told "98-99, except on the day of the treadmill." Pt states that last night on 2/22 he took 1/2 tablet of his Sotalol because he felt his HR was too high. Says that upon wakening in the early hours this morning and still feeling his HE was elevated, he took an additional half tablet and states "I feel great now." Pt says that as of this Am his BP was 162/77 and HR was 56. Pt wants to start taking 1/2 tablet of sotalol daily. I explained to pt that in the past he was taken off this medication due to bradycardia and he had restarted at once weekly dosing with excellent results "for years." Advised pt that his current HR is 56 which is below his baseline of 60 (per his verbal statement) after only 1 additional dose of 1/2 tablet and that if he began taking this daily as he is wanting, he will most likely have the bradycardia again. Pt then requests "how about every other day, then?" Advised pt that I cannot give that recommendation but can send to Dr Radford Pax for advisement. Did let him know that he did the correct thing in taking the sotalol initially with the symptoms of A-fib. Pt states that he still plans to take an extra dose each week and also requests a new RX be sent into pharmacy as he only has 3 tablets left on hand. Will route to [place] for dosing and refill approval.

## 2021-06-18 NOTE — Telephone Encounter (Signed)
Patient c/o Palpitations:  High priority if patient c/o lightheadedness, shortness of breath, or chest pain  How long have you had palpitations/irregular HR/ Afib? Started on Monday  Are you having the symptoms now? no  Are you currently experiencing lightheadedness, SOB or CP? no  Do you have a history of afib (atrial fibrillation) or irregular heart rhythm? yes  Have you checked your BP or HR? (document readings if available):  162/77 HR 56 Are you experiencing any other symptoms? No  Patient wanted to start taking 1/2 a sotalol daily. George Cain took half a tablet and it seemed to help his symptoms

## 2021-06-18 NOTE — Telephone Encounter (Signed)
STAT if HR is under 50 or over 120 (normal HR is 60-100 beats per minute)  What is your heart rate? 67  Do you have a log of your heart rate readings (document readings)? 61, 56, 67  Do you have any other symptoms? Pt can feel his heart fluttering  Patient c/o Palpitations:  High priority if patient c/o lightheadedness, shortness of breath, or chest pain  How long have you had palpitations/irregular HR/ Afib? Are you having the symptoms now? A couple of hours, no current symptoms  Are you currently experiencing lightheadedness, SOB or CP? no  Do you have a history of afib (atrial fibrillation) or irregular heart rhythm? yes  Have you checked your BP or HR? (document readings if available): yes 125/75 past hr readings listed above   Are you experiencing any other symptoms? no

## 2021-06-19 NOTE — Telephone Encounter (Signed)
Left message for patient that Dr. Radford Pax would like for him to be seen in the atrial fibrillation clinic. Advised that we will be in contact with him once we get an appointment. Advised to call back with any questions.

## 2021-06-19 NOTE — Telephone Encounter (Signed)
Appointment scheduled for 2/28

## 2021-06-22 DIAGNOSIS — M47816 Spondylosis without myelopathy or radiculopathy, lumbar region: Secondary | ICD-10-CM | POA: Diagnosis not present

## 2021-06-23 ENCOUNTER — Ambulatory Visit (HOSPITAL_COMMUNITY)
Admission: RE | Admit: 2021-06-23 | Discharge: 2021-06-23 | Disposition: A | Discharge status: Home / Self Care | Payer: Medicare Other | Source: Ambulatory Visit | Attending: Physician Assistant | Admitting: Physician Assistant

## 2021-06-23 ENCOUNTER — Encounter (HOSPITAL_COMMUNITY): Payer: Self-pay | Admitting: Physician Assistant

## 2021-06-23 ENCOUNTER — Other Ambulatory Visit: Payer: Self-pay

## 2021-06-23 VITALS — BP 128/72 | HR 62 | Ht 68.5 in | Wt 173.0 lb

## 2021-06-23 DIAGNOSIS — E785 Hyperlipidemia, unspecified: Secondary | ICD-10-CM | POA: Insufficient documentation

## 2021-06-23 DIAGNOSIS — Z79899 Other long term (current) drug therapy: Secondary | ICD-10-CM | POA: Insufficient documentation

## 2021-06-23 DIAGNOSIS — Z7901 Long term (current) use of anticoagulants: Secondary | ICD-10-CM | POA: Insufficient documentation

## 2021-06-23 DIAGNOSIS — I10 Essential (primary) hypertension: Secondary | ICD-10-CM | POA: Insufficient documentation

## 2021-06-23 DIAGNOSIS — D6869 Other thrombophilia: Secondary | ICD-10-CM | POA: Diagnosis not present

## 2021-06-23 DIAGNOSIS — I2510 Atherosclerotic heart disease of native coronary artery without angina pectoris: Secondary | ICD-10-CM | POA: Insufficient documentation

## 2021-06-23 DIAGNOSIS — I480 Paroxysmal atrial fibrillation: Secondary | ICD-10-CM | POA: Diagnosis not present

## 2021-06-23 LAB — BASIC METABOLIC PANEL
Anion gap: 6 (ref 5–15)
BUN: 25 mg/dL — ABNORMAL HIGH (ref 8–23)
CO2: 26 mmol/L (ref 22–32)
Calcium: 9.3 mg/dL (ref 8.9–10.3)
Chloride: 104 mmol/L (ref 98–111)
Creatinine, Ser: 1.58 mg/dL — ABNORMAL HIGH (ref 0.61–1.24)
GFR, Estimated: 41 mL/min — ABNORMAL LOW (ref >60)
Glucose, Bld: 112 mg/dL — ABNORMAL HIGH (ref 70–99)
Potassium: 4.4 mmol/L (ref 3.5–5.1)
Sodium: 136 mmol/L (ref 135–145)

## 2021-06-23 LAB — MAGNESIUM: Magnesium: 2.2 mg/dL (ref 1.7–2.4)

## 2021-06-23 NOTE — Progress Notes (Signed)
Primary Care Physician: [name], MD Primary Cardiologist: Dr Radford Pax Primary Electrophysiologist: Dr Caryl Comes (remotely) Referring Physician: Dr Pearla Dubonnet is a 88 y.o. male with a history of CAD, HTN, HLD, atrial fibrillation who presents for consultation in the [facility]. The patient was initially diagnosed with atrial fibrillation remotely and is s/p ablation in 2014. He has been maintained on once weekly sotalol with good success. He did have bradycardia on higher doses of sotalol. Patient is on Eliquis for a CHADS2VASC score of 4. He [facility] last week with intermittent hear racing. He took an extra dose of sotalol which seemed to resolve his symptoms. There were no specific triggers that he could identify but he does admit to drinking one beer daily.   Today, he denies symptoms of palpitations, chest pain, shortness of breath, orthopnea, PND, lower extremity edema, dizziness, presyncope, syncope, snoring, daytime somnolence, bleeding, or neurologic sequela. The patient is tolerating medications without difficulties and is otherwise without complaint today.    Atrial Fibrillation Risk Factors:  he does not have symptoms or diagnosis of sleep apnea. he does not have a history of rheumatic fever. he does have a history of alcohol use. The patient does not have a history of early familial atrial fibrillation or other arrhythmias.  he has a BMI of Body mass index is 25.92 kg/m.Marland Kitchen Filed Weights   06/23/21 1030  Weight: 78.5 kg    Family History  Problem Relation Age of Onset   Hypertension Mother    Heart attack Father    CAD Father    Diabetes Father    Prostate cancer Father      Atrial Fibrillation Management history:  Previous antiarrhythmic drugs: sotalol Previous cardioversions: none Previous ablations: 2014 CHADS2VASC score: 4 Anticoagulation history: Eliquis   Past Medical History:  Diagnosis Date   Allergic  rhinitis    Benign essential HTN    BPH (benign prostatic hypertrophy)    Coronary artery disease 2011   nonobstructive ASCAD with 20% LAD s   Hyperlipidemia    Patient refused statins   PAF (paroxysmal atrial fibrillation) ([place])    CHADS2VASC score 3 on Eliquis    Past Surgical History:  Procedure Laterality Date   CARDIAC CATHETERIZATION  2011   Nonobstructive ASCAD w 20% LAD   COLONOSCOPY  06/15/2010   RADIOFREQUENCY ABLATION  08/23/12   TONSILLECTOMY  1940   as child    Current Outpatient Medications  Medication Sig Dispense Refill   acetaminophen (TYLENOL) 500 MG tablet Take 500 mg by mouth 2 (two) times daily.     amLODipine (NORVASC) 2.5 MG tablet 1 tablet     apixaban (ELIQUIS) 5 MG TABS tablet TAKE ONE-HALF TABLET BY MOUTH TWICE A DAY (CAUTION: BLOOD THINNER)*APPROVED*     B Complex-C (SUPER B COMPLEX/VITAMIN C) TABS Take 1 tablet by mouth daily.     cetirizine (ZYRTEC) 10 MG tablet Take 10 mg by mouth daily.      docusate calcium (SURFAK) 240 MG capsule Take 1 capsule by mouth daily.     Ferrous Sulfate (IRON) 325 (65 Fe) MG TABS 1 tablet     finasteride (PROSCAR) 5 MG tablet Take 5 mg by mouth daily.     fluticasone (FLONASE) 50 MCG/ACT nasal spray Place 2 sprays into both nostrils daily.     ipratropium (ATROVENT HFA) 17 MCG/ACT inhaler Inhale 2 puffs into the lungs as directed.     ipratropium (ATROVENT) 0.03 % nasal  spray SPRAY 2 SPRAYS INTO EACH NOSTRIL THREE TIMES A DAY AS NEEDED     magnesium oxide (MAG-OX) 400 MG tablet Take 400 mg by mouth daily.     metroNIDAZOLE (METROCREAM) 0.75 % cream APPLY SMALL AMOUNT TO AFFECTED AREA DAILY     Multiple Vitamin (MULTI-VITAMINS) TABS Take by mouth daily.      Multiple Vitamins-Minerals (ICAPS AREDS 2 PO) Take by mouth daily.     omeprazole (PRILOSEC) 20 MG capsule Take 1 capsule by mouth daily.     polyethylene glycol (MIRALAX / GLYCOLAX) packet Take 17 g by mouth daily as needed for moderate constipation.     potassium  gluconate 595 (99 K) MG TABS tablet 1 tablet     sotalol (BETAPACE) 80 MG tablet TAKE ONE HALF TABLET BY MOUTH ONCE WEEKLY 10 tablet 2   No current facility-administered medications for this encounter.    Allergies  Allergen Reactions   Keflex [Cephalexin] Diarrhea, Nausea Only, Other (See Comments) and Nausea And Vomiting    Night sweats also Fever, chills diarrhea Night sweats also Fever, chills diarrhea Night sweats also    Social History   Socioeconomic History   Marital status: Married    Spouse name: Not on file   Number of children: Not on file   Years of education: Not on file   Highest education level: Not on file  Occupational History   Not on file  Tobacco Use   Smoking status: Never   Smokeless tobacco: Never  Vaping Use   Vaping Use: Never used  Substance and Sexual Activity   Alcohol use: Yes    Alcohol/week: 7.0 standard drinks    Types: 7 Cans of beer per week    Comment: 1 beer daily 06/23/21   Drug use: No   Sexual activity: Not on file  Other Topics Concern   Not on file  Social History Narrative   Not on file   Social Determinants of Health   Financial Resource Strain: Not on file  Food Insecurity: Not on file  Transportation Needs: Not on file  Physical Activity: Not on file  Stress: Not on file  Social Connections: Not on file  Intimate Partner Violence: Not on file     ROS- All systems are reviewed and negative except as per the HPI above.  Physical Exam: Vitals:   06/23/21 1030  BP: 128/72  Pulse: 62  Weight: 78.5 kg  Height: 5' 8.5" (1.74 m)    GEN- The patient is a well appearing elderly male, alert and oriented x 3 today.   Head- normocephalic, atraumatic Eyes-  Sclera clear, conjunctiva pink Ears- hearing intact Oropharynx- clear Neck- supple  Lungs- Clear to ausculation bilaterally, normal work of breathing Heart- Regular rate and rhythm, no murmurs, rubs or gallops  GI- soft, NT, ND, + BS Extremities- no  clubbing, cyanosis, or edema MS- no significant deformity or atrophy Skin- no rash or lesion Psych- euthymic mood, full affect Neuro- strength and sensation are intact  Wt Readings from Last 3 Encounters:  06/23/21 78.5 kg  06/12/20 79 kg  12/05/19 79.2 kg    EKG today demonstrates  SR Vent. rate 62 BPM PR interval 182 ms QRS duration 84 ms QT/QTcB 408/414 ms  Epic records are reviewed at length today  CHA2DS2-VASc Score = 4  The patient's score is based upon: CHF History: 0 HTN History: 1 Diabetes History: 0 Stroke History: 0 Vascular Disease History: 1 Age Score: 2 Gender Score: 0  ASSESSMENT AND PLAN: 1. Paroxysmal Atrial Fibrillation (ICD10:  I48.0) The patient's CHA2DS2-VASc score is 4, indicating a 4.8% annual risk of stroke.   Patient in [place] today. We discussed rhythm control options. We discussed that increasing his sotalol may result in bradycardia like it has in the past. We discussed changing AAD to dofetilide which does not affect heart rate. He does not feel his symptoms warrant a change in medication at this time.  Continue sotalol 40 mg weekly.  Continue Eliquis 2.5 mg BID Check bmet/mag today.  2. Secondary Hypercoagulable State (ICD10:  D68.69) The patient is at significant risk for stroke/thromboembolism based upon his CHA2DS2-VASc Score of 4.  Continue Apixaban (Eliquis).   3. HTN Stable, no changes today.  4. CAD No anginal symptoms.   Follow up in the AF clinic in one month.    [facility] [address] [place], [place] [address] [phone] 06/23/2021 10:58 AM

## 2021-07-22 ENCOUNTER — Other Ambulatory Visit: Payer: Self-pay

## 2021-07-22 ENCOUNTER — Encounter (HOSPITAL_COMMUNITY): Payer: Self-pay | Admitting: Physician Assistant

## 2021-07-22 ENCOUNTER — Ambulatory Visit (HOSPITAL_COMMUNITY)
Admission: RE | Admit: 2021-07-22 | Discharge: 2021-07-22 | Disposition: A | Discharge status: Home / Self Care | Payer: Medicare Other | Source: Ambulatory Visit | Attending: Physician Assistant | Admitting: Physician Assistant

## 2021-07-22 VITALS — BP 130/76 | HR 62 | Ht 68.5 in | Wt 173.6 lb

## 2021-07-22 DIAGNOSIS — I10 Essential (primary) hypertension: Secondary | ICD-10-CM | POA: Insufficient documentation

## 2021-07-22 DIAGNOSIS — I2510 Atherosclerotic heart disease of native coronary artery without angina pectoris: Secondary | ICD-10-CM | POA: Insufficient documentation

## 2021-07-22 DIAGNOSIS — E785 Hyperlipidemia, unspecified: Secondary | ICD-10-CM | POA: Diagnosis not present

## 2021-07-22 DIAGNOSIS — I480 Paroxysmal atrial fibrillation: Secondary | ICD-10-CM | POA: Insufficient documentation

## 2021-07-22 DIAGNOSIS — Z7901 Long term (current) use of anticoagulants: Secondary | ICD-10-CM | POA: Insufficient documentation

## 2021-07-22 DIAGNOSIS — D6869 Other thrombophilia: Secondary | ICD-10-CM | POA: Insufficient documentation

## 2021-07-22 NOTE — Progress Notes (Signed)
? ? ?Primary Care Physician: [name], MD ?Primary Cardiologist: Dr Radford Pax ?Primary Electrophysiologist: Dr Caryl Comes (remotely) ?Referring Physician: Dr Radford Pax ? ? ?George Cain is a 88 y.o. male with a history of CAD, HTN, HLD, atrial fibrillation who presents for follow up in the [facility]. The patient was initially diagnosed with atrial fibrillation remotely and is s/p ablation in 2014. He has been maintained on once weekly sotalol with good success. He did have bradycardia on higher doses of sotalol. Patient is on Eliquis for a CHADS2VASC score of 4. He called [facility] with intermittent hear racing. He took an extra dose of sotalol which seemed to resolve his symptoms. There were no specific triggers that he could identify but he does admit to drinking one beer daily.  ? ?On follow up today, patient reports that he has done well since his last visit. He denies any interim issues with afib. No bleeding issues on anticoagulation.  ? ?Today, he denies symptoms of palpitations, chest pain, shortness of breath, orthopnea, PND, lower extremity edema, dizziness, presyncope, syncope, snoring, daytime somnolence, bleeding, or neurologic sequela. The patient is tolerating medications without difficulties and is otherwise without complaint today.  ? ? ?Atrial Fibrillation Risk Factors: ? ?he does not have symptoms or diagnosis of sleep apnea. ?he does not have a history of rheumatic fever. ?he does have a history of alcohol use. ?The patient does not have a history of early familial atrial fibrillation or other arrhythmias. ? ?he has a BMI of Body mass index is 26.01 kg/m?Marland KitchenMarland Kitchen ?Filed Weights  ? 07/22/21 1033  ?Weight: 78.7 kg  ? ? ? ?Family History  ?Problem Relation Age of Onset  ? Hypertension Mother   ? Heart attack Father   ? CAD Father   ? Diabetes Father   ? Prostate cancer Father   ? ? ? ?Atrial Fibrillation Management history: ? ?Previous antiarrhythmic drugs: sotalol ?Previous  cardioversions: none ?Previous ablations: 2014 ?CHADS2VASC score: 4 ?Anticoagulation history: Eliquis ? ? ?Past Medical History:  ?Diagnosis Date  ? Allergic rhinitis   ? Benign essential HTN   ? BPH (benign prostatic hypertrophy)   ? Coronary artery disease 2011  ? nonobstructive ASCAD with 20% LAD s  ? Hyperlipidemia   ? Patient refused statins  ? PAF (paroxysmal atrial fibrillation) ([place])   ? CHADS2VASC score 3 on Eliquis   ? ?Past Surgical History:  ?Procedure Laterality Date  ? CARDIAC CATHETERIZATION  2011  ? Nonobstructive ASCAD w 20% LAD  ? COLONOSCOPY  06/15/2010  ? RADIOFREQUENCY ABLATION  08/23/12  ? TONSILLECTOMY  1940  ? as child  ? ? ?Current Outpatient Medications  ?Medication Sig Dispense Refill  ? acetaminophen (TYLENOL) 500 MG tablet Take 500 mg by mouth 2 (two) times daily.    ? amLODipine (NORVASC) 2.5 MG tablet 1 tablet    ? apixaban (ELIQUIS) 5 MG TABS tablet TAKE ONE-HALF TABLET BY MOUTH TWICE A DAY (CAUTION: BLOOD THINNER)*APPROVED*    ? B Complex-C (SUPER B COMPLEX/VITAMIN C) TABS Take 1 tablet by mouth daily.    ? cetirizine (ZYRTEC) 10 MG tablet Take 10 mg by mouth daily.     ? docusate calcium (SURFAK) 240 MG capsule Take 1 capsule by mouth daily.    ? Ferrous Sulfate (IRON) 325 (65 Fe) MG TABS 1 tablet    ? finasteride (PROSCAR) 5 MG tablet Take 5 mg by mouth daily.    ? fluticasone (FLONASE) 50 MCG/ACT nasal spray Place 2 sprays into both  nostrils daily.    ? ipratropium (ATROVENT HFA) 17 MCG/ACT inhaler Inhale 2 puffs into the lungs as directed.    ? ipratropium (ATROVENT) 0.03 % nasal spray SPRAY 2 SPRAYS INTO EACH NOSTRIL THREE TIMES A DAY AS NEEDED    ? magnesium oxide (MAG-OX) 400 MG tablet Take 400 mg by mouth daily.    ? metroNIDAZOLE (METROCREAM) 0.75 % cream APPLY SMALL AMOUNT TO AFFECTED AREA DAILY    ? Multiple Vitamin (MULTI-VITAMINS) TABS Take by mouth daily.     ? Multiple Vitamins-Minerals (ICAPS AREDS 2 PO) Take by mouth daily.    ? omeprazole (PRILOSEC) 20 MG capsule  Take 1 capsule by mouth daily.    ? polyethylene glycol (MIRALAX / GLYCOLAX) packet Take 17 g by mouth daily as needed for moderate constipation.    ? potassium gluconate 595 (99 K) MG TABS tablet 1 tablet    ? sotalol (BETAPACE) 80 MG tablet TAKE ONE HALF TABLET BY MOUTH ONCE WEEKLY 10 tablet 2  ? ?No current facility-administered medications for this encounter.  ? ? ?Allergies  ?Allergen Reactions  ? Keflex [Cephalexin] Diarrhea, Nausea Only, Other (See Comments) and Nausea And Vomiting  ?  Night sweats also ?Fever, chills diarrhea ?Night sweats also ?Fever, chills diarrhea ?Night sweats also  ? ? ?Social History  ? ?Socioeconomic History  ? Marital status: Married  ?  Spouse name: Not on file  ? Number of children: Not on file  ? Years of education: Not on file  ? Highest education level: Not on file  ?Occupational History  ? Not on file  ?Tobacco Use  ? Smoking status: Never  ? Smokeless tobacco: Never  ? Tobacco comments:  ?  Never smoke 07/22/21  ?Vaping Use  ? Vaping Use: Never used  ?Substance and Sexual Activity  ? Alcohol use: Yes  ?  Alcohol/week: 7.0 standard drinks  ?  Types: 7 Cans of beer per week  ?  Comment: 1 beer daily 06/23/21  ? Drug use: No  ? Sexual activity: Not on file  ?Other Topics Concern  ? Not on file  ?Social History Narrative  ? Not on file  ? ?Social Determinants of Health  ? ?Financial Resource Strain: Not on file  ?Food Insecurity: Not on file  ?Transportation Needs: Not on file  ?Physical Activity: Not on file  ?Stress: Not on file  ?Social Connections: Not on file  ?Intimate Partner Violence: Not on file  ? ? ? ?ROS- All systems are reviewed and negative except as per the HPI above. ? ?Physical Exam: ?Vitals:  ? 07/22/21 1033  ?BP: 130/76  ?Pulse: 62  ?Weight: 78.7 kg  ?Height: 5' 8.5" (1.74 m)  ? ? ?GEN- The patient is a well appearing elderly male, alert and oriented x 3 today.   ?HEENT-head normocephalic, atraumatic, sclera clear, conjunctiva pink, hearing intact, trachea  midline. ?Lungs- Clear to ausculation bilaterally, normal work of breathing ?Heart- Regular rate and rhythm, no murmurs, rubs or gallops  ?GI- soft, NT, ND, + BS ?Extremities- no clubbing, cyanosis, or edema ?MS- no significant deformity or atrophy ?Skin- no rash or lesion ?Psych- euthymic mood, full affect ?Neuro- strength and sensation are intact ? ? ?Wt Readings from Last 3 Encounters:  ?07/22/21 78.7 kg  ?06/23/21 78.5 kg  ?06/12/20 79 kg  ? ? ?EKG today demonstrates  ?SR ?Vent. rate 62 BPM ?PR interval 180 ms ?QRS duration 80 ms ?QT/QTcB 396/401 ms ? ?Epic records are reviewed at length today ? ?  CHA2DS2-VASc Score = 4  ?The patient's score is based upon: ?CHF History: 0 ?HTN History: 1 ?Diabetes History: 0 ?Stroke History: 0 ?Vascular Disease History: 1 ?Age Score: 2 ?Gender Score: 0 ?    ? ? ?ASSESSMENT AND PLAN: ?1. Paroxysmal Atrial Fibrillation (ICD10:  I48.0) ?The patient's CHA2DS2-VASc score is 4, indicating a 4.8% annual risk of stroke.   ?Patient appears to be maintaining SR.  ?Continue sotalol 40 mg weekly ?Continue Eliquis 2.5 mg BID ? ?2. Secondary Hypercoagulable State (ICD10:  D68.69) ?The patient is at significant risk for stroke/thromboembolism based upon his CHA2DS2-VASc Score of 4.  Continue Apixaban (Eliquis).  ? ?3. HTN ?Stable, no changes today. ? ?4. CAD ?No anginal symptoms. ? ? ?Follow up with Dr Radford Pax as scheduled.  ? ? ?Ricky Cattaleya Wien PA-C ?Afib Clinic ?[facility] ?[address] ?[place], [place] [address] ?[phone] ?07/22/2021 ?10:38 AM ? ?

## 2021-08-27 DIAGNOSIS — M7918 Myalgia, other site: Secondary | ICD-10-CM | POA: Diagnosis not present

## 2021-08-27 DIAGNOSIS — M549 Dorsalgia, unspecified: Secondary | ICD-10-CM | POA: Diagnosis not present

## 2021-08-27 DIAGNOSIS — M47816 Spondylosis without myelopathy or radiculopathy, lumbar region: Secondary | ICD-10-CM | POA: Diagnosis not present

## 2021-09-09 DIAGNOSIS — Z1331 Encounter for screening for depression: Secondary | ICD-10-CM | POA: Diagnosis not present

## 2021-09-09 DIAGNOSIS — I2510 Atherosclerotic heart disease of native coronary artery without angina pectoris: Secondary | ICD-10-CM | POA: Diagnosis not present

## 2021-09-09 DIAGNOSIS — M47812 Spondylosis without myelopathy or radiculopathy, cervical region: Secondary | ICD-10-CM | POA: Diagnosis not present

## 2021-09-09 DIAGNOSIS — J309 Allergic rhinitis, unspecified: Secondary | ICD-10-CM | POA: Diagnosis not present

## 2021-09-09 DIAGNOSIS — D6869 Other thrombophilia: Secondary | ICD-10-CM | POA: Diagnosis not present

## 2021-09-09 DIAGNOSIS — I480 Paroxysmal atrial fibrillation: Secondary | ICD-10-CM | POA: Diagnosis not present

## 2021-09-09 DIAGNOSIS — Z0000 Encounter for general adult medical examination without abnormal findings: Secondary | ICD-10-CM | POA: Diagnosis not present

## 2021-09-09 DIAGNOSIS — N400 Enlarged prostate without lower urinary tract symptoms: Secondary | ICD-10-CM | POA: Diagnosis not present

## 2021-09-09 DIAGNOSIS — N1831 Chronic kidney disease, stage 3a: Secondary | ICD-10-CM | POA: Diagnosis not present

## 2021-09-09 DIAGNOSIS — I129 Hypertensive chronic kidney disease with stage 1 through stage 4 chronic kidney disease, or unspecified chronic kidney disease: Secondary | ICD-10-CM | POA: Diagnosis not present

## 2021-09-16 DIAGNOSIS — M47816 Spondylosis without myelopathy or radiculopathy, lumbar region: Secondary | ICD-10-CM | POA: Diagnosis not present

## 2021-10-19 DIAGNOSIS — M47816 Spondylosis without myelopathy or radiculopathy, lumbar region: Secondary | ICD-10-CM | POA: Diagnosis not present

## 2021-10-20 ENCOUNTER — Encounter: Payer: Self-pay | Admitting: Cardiology

## 2021-10-20 ENCOUNTER — Ambulatory Visit (INDEPENDENT_AMBULATORY_CARE_PROVIDER_SITE_OTHER): Payer: Medicare Other | Admitting: Cardiology

## 2021-10-20 VITALS — BP 108/70 | HR 85 | Ht 68.5 in | Wt 172.0 lb

## 2021-10-20 DIAGNOSIS — E785 Hyperlipidemia, unspecified: Secondary | ICD-10-CM

## 2021-10-20 DIAGNOSIS — I480 Paroxysmal atrial fibrillation: Secondary | ICD-10-CM | POA: Diagnosis not present

## 2021-10-20 DIAGNOSIS — I2510 Atherosclerotic heart disease of native coronary artery without angina pectoris: Secondary | ICD-10-CM | POA: Diagnosis not present

## 2021-10-20 DIAGNOSIS — I10 Essential (primary) hypertension: Secondary | ICD-10-CM

## 2021-10-20 LAB — CBC
Hematocrit: 37.7 % (ref 37.5–51.0)
Hemoglobin: 12.8 g/dL — ABNORMAL LOW (ref 13.0–17.7)
MCH: 31.1 pg (ref 26.6–33.0)
MCHC: 34 g/dL (ref 31.5–35.7)
MCV: 92 fL (ref 79–97)
Platelets: 229 10*3/uL (ref 150–450)
RBC: 4.11 x10E6/uL — ABNORMAL LOW (ref 4.14–5.80)
RDW: 12.5 % (ref 11.6–15.4)
WBC: 7.3 10*3/uL (ref 3.4–10.8)

## 2021-10-20 NOTE — Progress Notes (Addendum)
Cardiology Office Note:    Date:  10/20/2021   ID:  George Cain, DOB 1930-01-06, MRN [id number]  PCP:  Deatra James, MD  Cardiologist:  Armanda Magic, MD    Referring MD: Deatra James, MD   Chief Complaint  Patient presents with   Coronary Artery Disease   Hypertension   Hyperlipidemia   Atrial Fibrillation    History of Present Illness:    George Cain is a 88 y.o. male with a hx of PAF s/p RFA 07/2012, nonobstructive ASCAD with 20% LAD by cath 2011, HTN and dyslipidemia.  He has declined anticoagulation in the past.  He was seen by Dr. Graciela Husbands due to bradycardia on Sotolol and the sotolol was stopped.  He continued to refuse anticoagulation.  He started to have palpitations again and he restarted Sotolol at 40mg  weekly and eventually went on Eliquis 5mg  BID.  He saw Herma Carson, PA this summer and BP was elevated and he was started on low dose amlodipine.   He is followed in A-fib clinic and recently was seen in March and was maintaining sinus rhythm on sotalol 40 mg weekly.  He is also on Eliquis 2.5 mg twice daily.  He is here today for followup and is doing well.  He denies any chest pain or pressure, SOB, DOE, PND, orthopnea, LE edema, dizziness or syncope. He did have a brief flutter in his chest when he laid on his left side last night and he rolled over and it went away. Otherwise he has not had any palpitations.  He is compliant with his meds and is tolerating meds with no SE.     Past Medical History:  Diagnosis Date   Allergic rhinitis    Benign essential HTN    BPH (benign prostatic hypertrophy)    Coronary artery disease 2011   nonobstructive ASCAD with 20% LAD s   Hyperlipidemia    Patient refused statins   PAF (paroxysmal atrial fibrillation) ([facility])    CHADS2VASC score 3 on Eliquis     Past Surgical History:  Procedure Laterality Date   CARDIAC CATHETERIZATION  2011   Nonobstructive ASCAD w 20% LAD   COLONOSCOPY  06/15/2010   RADIOFREQUENCY ABLATION   08/23/12   TONSILLECTOMY  1940   as child    Current Medications: Current Meds  Medication Sig   acetaminophen (TYLENOL) 500 MG tablet Take 500 mg by mouth 2 (two) times daily.   amLODipine (NORVASC) 2.5 MG tablet 1 tablet   apixaban (ELIQUIS) 5 MG TABS tablet TAKE ONE-HALF TABLET BY MOUTH TWICE A DAY (CAUTION: BLOOD THINNER)*APPROVED*   B Complex-C (SUPER B COMPLEX/VITAMIN C) TABS Take 1 tablet by mouth daily.   cetirizine (ZYRTEC) 10 MG tablet Take 10 mg by mouth daily.    docusate calcium (SURFAK) 240 MG capsule Take 1 capsule by mouth daily.   Ferrous Sulfate (IRON) 325 (65 Fe) MG TABS 1 tablet   finasteride (PROSCAR) 5 MG tablet Take 5 mg by mouth daily.   fluticasone (FLONASE) 50 MCG/ACT nasal spray Place 2 sprays into both nostrils daily.   ipratropium (ATROVENT HFA) 17 MCG/ACT inhaler Inhale 2 puffs into the lungs as directed.   ipratropium (ATROVENT) 0.03 % nasal spray SPRAY 2 SPRAYS INTO EACH NOSTRIL THREE TIMES A DAY AS NEEDED   magnesium oxide (MAG-OX) 400 MG tablet Take 400 mg by mouth daily.   methocarbamol (ROBAXIN) 500 MG tablet 1 tablet   metroNIDAZOLE (METROCREAM) 0.75 % cream APPLY SMALL AMOUNT TO AFFECTED AREA DAILY  Multiple Vitamin (MULTI-VITAMINS) TABS Take by mouth daily.    Multiple Vitamins-Minerals (ICAPS AREDS 2 PO) Take by mouth daily.   omeprazole (PRILOSEC) 20 MG capsule Take 1 capsule by mouth daily.   polyethylene glycol (MIRALAX / GLYCOLAX) packet Take 17 g by mouth daily as needed for moderate constipation.   potassium gluconate 595 (99 K) MG TABS tablet 1 tablet   sotalol (BETAPACE) 80 MG tablet TAKE ONE HALF TABLET BY MOUTH ONCE WEEKLY     Allergies:   Keflex [cephalexin]   Social History   Socioeconomic History   Marital status: Married    Spouse name: Not on file   Number of children: Not on file   Years of education: Not on file   Highest education level: Not on file  Occupational History   Not on file  Tobacco Use   Smoking status:  Never   Smokeless tobacco: Never   Tobacco comments:    Never smoke 07/22/21  Vaping Use   Vaping Use: Never used  Substance and Sexual Activity   Alcohol use: Yes    Alcohol/week: 7.0 standard drinks of alcohol    Types: 7 Cans of beer per week    Comment: 1 beer daily 06/23/21   Drug use: No   Sexual activity: Not on file  Other Topics Concern   Not on file  Social History Narrative   Not on file   Social Determinants of Health   Financial Resource Strain: Not on file  Food Insecurity: Not on file  Transportation Needs: Not on file  Physical Activity: Not on file  Stress: Not on file  Social Connections: Not on file     Family History: The patient's family history includes CAD in his father; Diabetes in his father; Heart attack in his father; Hypertension in his mother; Prostate cancer in his father.  ROS:   Please see the history of present illness.    ROS  All other systems reviewed and negative.   EKGs/Labs/Other Studies Reviewed:    The following studies were reviewed today: Outside labs on [facility]  EKG:  EKG is ordered today and demonstrates atrial fibrillation with CVR at 77bpm with no ST changes  Recent Labs: 06/23/2021: BUN 25; Creatinine, Ser 1.58; Magnesium 2.2; Potassium 4.4; Sodium 136   Recent Lipid Panel    Component Value Date/Time   CHOL 176 09/25/2015 1011   TRIG 111 09/25/2015 1011   HDL 68 09/25/2015 1011   CHOLHDL 2.6 09/25/2015 1011   VLDL 22 09/25/2015 1011   LDLCALC 86 09/25/2015 1011    Physical Exam:    VS:  BP 108/70   Pulse 85   Ht 5' 8.5" (1.74 m)   Wt 172 lb (78 kg)   SpO2 98%   BMI 25.77 kg/m     Wt Readings from Last 3 Encounters:  10/20/21 172 lb (78 kg)  07/22/21 173 lb 9.6 oz (78.7 kg)  06/23/21 173 lb (78.5 kg)     GEN: Well nourished, well developed in no acute distress HEENT: Normal NECK: No JVD; No carotid bruits LYMPHATICS: No lymphadenopathy CARDIAC:irregularly irregular, no murmurs, rubs,  gallops RESPIRATORY:  Clear to auscultation without rales, wheezing or rhonchi  ABDOMEN: Soft, non-tender, non-distended MUSCULOSKELETAL:  No edema; No deformity  SKIN: Warm and dry NEUROLOGIC:  Alert and oriented x 3 PSYCHIATRIC:  Normal affect   ASSESSMENT:    1. Coronary artery disease involving native coronary artery of native heart without angina pectoris   2. Paroxysmal  atrial fibrillation (HCC)   3. Dyslipidemia   4. Benign essential HTN    PLAN:    In order of problems listed above:  1.  ASCAD -nonobstructive ASCAD with 20% LAD by cath 2011.   -he has not had any anginal sx since I saw him last -no ASA due to DOAC -he has refused statin therapy  2.  PAF  -he felt fluttering last night in bed and today is in atrial fibrillation with CVR -continue Eliquis 2.5mg  BID (dosed for age > 80 and SCR . 1.5) and Sotolol 40mg  weekly -I have personally reviewed and interpreted outside labs performed by patient's PCP which showed SCr 1.47 and K+ 4.5  -he denies any significant bleeding issues -check CBC today -I will get him back in to afib clinic for further recommendations  3.  HLD -LDL goal is < 70 but he has refused statin therapy  4.  HTN -BP well controlled today -continue prescription drug management with amlodipine 2.5mg  daily with PRN refills  Followup with me in 1 year  Medication Adjustments/Labs and Tests Ordered: Current medicines are reviewed at length with the patient today.  Concerns regarding medicines are outlined above.  No orders of the defined types were placed in this encounter.  No orders of the defined types were placed in this encounter.   Signed, Armanda Magic, MD  10/20/2021 1:12 PM    [facility]

## 2021-10-28 ENCOUNTER — Ambulatory Visit (HOSPITAL_COMMUNITY): Payer: Medicare Other | Admitting: Physician Assistant

## 2021-11-02 ENCOUNTER — Ambulatory Visit (HOSPITAL_COMMUNITY)
Admission: RE | Admit: 2021-11-02 | Discharge: 2021-11-02 | Disposition: A | Discharge status: Home / Self Care | Payer: Medicare Other | Source: Ambulatory Visit | Attending: Physician Assistant | Admitting: Physician Assistant

## 2021-11-02 ENCOUNTER — Encounter (HOSPITAL_COMMUNITY): Payer: Self-pay | Admitting: Physician Assistant

## 2021-11-02 VITALS — BP 130/82 | HR 56 | Ht 68.5 in | Wt 172.0 lb

## 2021-11-02 DIAGNOSIS — Z79899 Other long term (current) drug therapy: Secondary | ICD-10-CM | POA: Insufficient documentation

## 2021-11-02 DIAGNOSIS — I10 Essential (primary) hypertension: Secondary | ICD-10-CM | POA: Insufficient documentation

## 2021-11-02 DIAGNOSIS — D6869 Other thrombophilia: Secondary | ICD-10-CM | POA: Insufficient documentation

## 2021-11-02 DIAGNOSIS — I4819 Other persistent atrial fibrillation: Secondary | ICD-10-CM | POA: Insufficient documentation

## 2021-11-02 DIAGNOSIS — I2510 Atherosclerotic heart disease of native coronary artery without angina pectoris: Secondary | ICD-10-CM | POA: Diagnosis not present

## 2021-11-02 DIAGNOSIS — E785 Hyperlipidemia, unspecified: Secondary | ICD-10-CM | POA: Diagnosis not present

## 2021-11-02 DIAGNOSIS — Z7901 Long term (current) use of anticoagulants: Secondary | ICD-10-CM | POA: Insufficient documentation

## 2021-11-02 NOTE — Progress Notes (Signed)
Primary Care Physician: [name], MD Primary Cardiologist: Dr Radford Pax Primary Electrophysiologist: Dr Caryl Comes (remotely) Referring Physician: Dr Pearla Dubonnet is a 88 y.o. male with a history of CAD, HTN, HLD, atrial fibrillation who presents for follow up in the [facility]. The patient was initially diagnosed with atrial fibrillation remotely and is s/p ablation in 2014. He has been maintained on once weekly sotalol with good success. He did have bradycardia on higher doses of sotalol. Patient is on Eliquis for a CHADS2VASC score of 4.   On follow up today, patient has converted back to SR. He states that he was out of rhythm for about two weeks. He does report that he had a steroid injection in his hip just before he went into afib. He denies any bleeding issues on anticoagulation.   Today, he denies symptoms of palpitations, chest pain, shortness of breath, orthopnea, PND, lower extremity edema, dizziness, presyncope, syncope, snoring, daytime somnolence, bleeding, or neurologic sequela. The patient is tolerating medications without difficulties and is otherwise without complaint today.    Atrial Fibrillation Risk Factors:  he does not have symptoms or diagnosis of sleep apnea. he does not have a history of rheumatic fever. he does have a history of alcohol use. The patient does not have a history of early familial atrial fibrillation or other arrhythmias.  he has a BMI of Body mass index is 25.77 kg/m.Marland Kitchen Filed Weights   11/02/21 1116  Weight: 78 kg    Family History  Problem Relation Age of Onset   Hypertension Mother    Heart attack Father    CAD Father    Diabetes Father    Prostate cancer Father      Atrial Fibrillation Management history:  Previous antiarrhythmic drugs: sotalol Previous cardioversions: none Previous ablations: 2014 CHADS2VASC score: 4 Anticoagulation history: Eliquis   Past Medical History:  Diagnosis  Date   Allergic rhinitis    Benign essential HTN    BPH (benign prostatic hypertrophy)    Coronary artery disease 2011   nonobstructive ASCAD with 20% LAD s   Hyperlipidemia    Patient refused statins   PAF (paroxysmal atrial fibrillation) ([place])    CHADS2VASC score 3 on Eliquis    Past Surgical History:  Procedure Laterality Date   CARDIAC CATHETERIZATION  2011   Nonobstructive ASCAD w 20% LAD   COLONOSCOPY  06/15/2010   RADIOFREQUENCY ABLATION  08/23/12   TONSILLECTOMY  1940   as child    Current Outpatient Medications  Medication Sig Dispense Refill   acetaminophen (TYLENOL) 500 MG tablet Take 500 mg by mouth 2 (two) times daily.     amLODipine (NORVASC) 2.5 MG tablet 1 tablet     apixaban (ELIQUIS) 5 MG TABS tablet TAKE ONE-HALF TABLET BY MOUTH TWICE A DAY (CAUTION: BLOOD THINNER)*APPROVED*     B Complex-C (SUPER B COMPLEX/VITAMIN C) TABS Take 1 tablet by mouth daily.     cetirizine (ZYRTEC) 10 MG tablet Take 10 mg by mouth daily.      docusate calcium (SURFAK) 240 MG capsule Take 1 capsule by mouth daily.     Ferrous Sulfate (IRON) 325 (65 Fe) MG TABS 1 tablet     finasteride (PROSCAR) 5 MG tablet Take 5 mg by mouth daily.     fluticasone (FLONASE) 50 MCG/ACT nasal spray Place 2 sprays into both nostrils daily.     ipratropium (ATROVENT HFA) 17 MCG/ACT inhaler Inhale 2 puffs into the lungs as  directed.     ipratropium (ATROVENT) 0.03 % nasal spray SPRAY 2 SPRAYS INTO EACH NOSTRIL THREE TIMES A DAY AS NEEDED     magnesium oxide (MAG-OX) 400 MG tablet Take 400 mg by mouth daily.     methocarbamol (ROBAXIN) 500 MG tablet 1 tablet     metroNIDAZOLE (METROCREAM) 0.75 % cream APPLY SMALL AMOUNT TO AFFECTED AREA DAILY     Multiple Vitamin (MULTI-VITAMINS) TABS Take by mouth daily.      Multiple Vitamins-Minerals (ICAPS AREDS 2 PO) Take by mouth daily.     omeprazole (PRILOSEC) 20 MG capsule Take 1 capsule by mouth daily.     polyethylene glycol (MIRALAX / GLYCOLAX) packet Take 17  g by mouth daily as needed for moderate constipation.     potassium gluconate 595 (99 K) MG TABS tablet 1 tablet     sotalol (BETAPACE) 80 MG tablet TAKE ONE HALF TABLET BY MOUTH ONCE WEEKLY 10 tablet 2   No current facility-administered medications for this encounter.    Allergies  Allergen Reactions   Keflex [Cephalexin] Diarrhea, Nausea Only, Other (See Comments) and Nausea And Vomiting    Night sweats also Fever, chills diarrhea Night sweats also Fever, chills diarrhea Night sweats also    Social History   Socioeconomic History   Marital status: Married    Spouse name: Not on file   Number of children: Not on file   Years of education: Not on file   Highest education level: Not on file  Occupational History   Not on file  Tobacco Use   Smoking status: Never   Smokeless tobacco: Never   Tobacco comments:    Never smoke 07/22/21  Vaping Use   Vaping Use: Never used  Substance and Sexual Activity   Alcohol use: Yes    Alcohol/week: 7.0 standard drinks of alcohol    Types: 7 Cans of beer per week    Comment: 1 beer daily 06/23/21   Drug use: No   Sexual activity: Not on file  Other Topics Concern   Not on file  Social History Narrative   Not on file   Social Determinants of Health   Financial Resource Strain: Not on file  Food Insecurity: Not on file  Transportation Needs: Not on file  Physical Activity: Not on file  Stress: Not on file  Social Connections: Not on file  Intimate Partner Violence: Not on file     ROS- All systems are reviewed and negative except as per the HPI above.  Physical Exam: Vitals:   11/02/21 1116  BP: 130/82  Pulse: (!) 56  Weight: 78 kg  Height: 5' 8.5" (1.74 m)     GEN- The patient is a well appearing elderly male, alert and oriented x 3 today.   HEENT-head normocephalic, atraumatic, sclera clear, conjunctiva pink, hearing intact, trachea midline. Lungs- Clear to ausculation bilaterally, normal work of  breathing Heart- Regular rate and rhythm, no murmurs, rubs or gallops  GI- soft, NT, ND, + BS Extremities- no clubbing, cyanosis, or edema MS- no significant deformity or atrophy Skin- no rash or lesion Psych- euthymic mood, full affect Neuro- strength and sensation are intact   Wt Readings from Last 3 Encounters:  11/02/21 78 kg  10/20/21 78 kg  07/22/21 78.7 kg    EKG today demonstrates  SB Vent. rate 56 BPM PR interval 176 ms QRS duration 86 ms QT/QTcB 424/409 ms  Epic records are reviewed at length today  CHA2DS2-VASc Score = 4  The patient's score is based upon: CHF History: 0 HTN History: 1 Diabetes History: 0 Stroke History: 0 Vascular Disease History: 1 Age Score: 2 Gender Score: 0       ASSESSMENT AND PLAN: 1. Persistent atrial fibrillation  The patient's CHA2DS2-VASc score is 4, indicating a 4.8% annual risk of stroke.   Patient back in SR. Suspect related to steroid injection. We briefly discussed changing AAD to dofetilide. Patient would like to keep on present therapy for now.  Continue sotalol 40 mg weekly Continue Eliquis 2.5 mg BID  2. Secondary Hypercoagulable State (ICD10:  D68.69) The patient is at significant risk for stroke/thromboembolism based upon his CHA2DS2-VASc Score of 4.  Continue Apixaban (Eliquis).   3. HTN Stable, no changes today.  4. CAD No anginal symptoms.   Follow up with Dr Radford Pax as scheduled. AF clinic in 6 months.    [facility] [address] [place], [place] [address] [phone] 11/02/2021 11:36 AM

## 2021-11-04 DIAGNOSIS — M47816 Spondylosis without myelopathy or radiculopathy, lumbar region: Secondary | ICD-10-CM | POA: Diagnosis not present

## 2021-11-05 ENCOUNTER — Other Ambulatory Visit: Payer: Self-pay | Admitting: Cardiology

## 2021-12-04 DIAGNOSIS — M47816 Spondylosis without myelopathy or radiculopathy, lumbar region: Secondary | ICD-10-CM | POA: Diagnosis not present

## 2022-01-20 DIAGNOSIS — Z23 Encounter for immunization: Secondary | ICD-10-CM | POA: Diagnosis not present

## 2022-03-02 DIAGNOSIS — Z23 Encounter for immunization: Secondary | ICD-10-CM | POA: Diagnosis not present

## 2022-03-26 ENCOUNTER — Ambulatory Visit: Payer: Medicare Other | Admitting: Cardiology

## 2022-03-29 DIAGNOSIS — L718 Other rosacea: Secondary | ICD-10-CM | POA: Diagnosis not present

## 2022-03-29 DIAGNOSIS — L84 Corns and callosities: Secondary | ICD-10-CM | POA: Diagnosis not present

## 2022-04-05 ENCOUNTER — Ambulatory Visit: Payer: Medicare Other | Admitting: Cardiology

## 2022-05-19 ENCOUNTER — Ambulatory Visit: Payer: Medicare Other | Attending: Cardiology | Admitting: Cardiology

## 2022-05-19 ENCOUNTER — Encounter: Payer: Self-pay | Admitting: Cardiology

## 2022-05-19 VITALS — BP 138/74 | HR 66 | Ht 68.5 in | Wt 174.0 lb

## 2022-05-19 DIAGNOSIS — I2510 Atherosclerotic heart disease of native coronary artery without angina pectoris: Secondary | ICD-10-CM

## 2022-05-19 DIAGNOSIS — I10 Essential (primary) hypertension: Secondary | ICD-10-CM

## 2022-05-19 DIAGNOSIS — E785 Hyperlipidemia, unspecified: Secondary | ICD-10-CM | POA: Diagnosis not present

## 2022-05-19 DIAGNOSIS — I480 Paroxysmal atrial fibrillation: Secondary | ICD-10-CM | POA: Diagnosis not present

## 2022-05-19 NOTE — Addendum Note (Signed)
Addended by: Joni Reining on: 05/19/2022 11:53 AM   Modules accepted: Orders

## 2022-05-19 NOTE — Progress Notes (Addendum)
Cardiology Office Note:    Date:  05/19/2022   ID:  George Cain, DOB 11-23-1929, MRN [id number]  PCP:  [name], MD  Cardiologist:  Fransico Him, MD    Referring MD: [name], MD   Chief Complaint  Patient presents with   Coronary Artery Disease   Atrial Fibrillation   Hypertension   Hyperlipidemia    History of Present Illness:    George Cain is a 89 y.o. male with a hx of PAF s/p RFA 07/2012, nonobstructive ASCAD with 20% LAD by cath 2011, HTN and dyslipidemia.  He has declined anticoagulation in the past.  He was seen by Dr. Caryl Comes due to bradycardia on Sotolol and the sotolol was stopped.  He continued to refuse anticoagulation.  He started to have palpitations again and he restarted Sotolol at '40mg'$  weekly and eventually went on Eliquis '5mg'$  BID.  He is followed in A-fib clinic.  He is here today for followup and is doing well.  He denies any chest pain or pressure, SOB, DOE, PND, orthopnea, LE edema, dizziness, palpitations or syncope. He is compliant with his meds and is tolerating meds with no SE.   Past Medical History:  Diagnosis Date   Allergic rhinitis    Benign essential HTN    BPH (benign prostatic hypertrophy)    Coronary artery disease 2011   nonobstructive ASCAD with 20% LAD s   Hyperlipidemia    Patient refused statins   PAF (paroxysmal atrial fibrillation) ([place])    CHADS2VASC score 3 on Eliquis     Past Surgical History:  Procedure Laterality Date   CARDIAC CATHETERIZATION  2011   Nonobstructive ASCAD w 20% LAD   COLONOSCOPY  06/15/2010   RADIOFREQUENCY ABLATION  08/23/12   TONSILLECTOMY  1940   as child    Current Medications: Current Meds  Medication Sig   acetaminophen (TYLENOL) 500 MG tablet Take 500 mg by mouth 2 (two) times daily.   amLODipine (NORVASC) 2.5 MG tablet 1 tablet   apixaban (ELIQUIS) 5 MG TABS tablet TAKE ONE-HALF TABLET BY MOUTH TWICE A DAY (CAUTION: BLOOD THINNER)*APPROVED*   B Complex-C (SUPER B COMPLEX/VITAMIN C) TABS  Take 1 tablet by mouth daily.   cetirizine (ZYRTEC) 10 MG tablet Take 10 mg by mouth daily.    docusate calcium (SURFAK) 240 MG capsule Take 1 capsule by mouth daily.   Ferrous Sulfate (IRON) 325 (65 Fe) MG TABS 1 tablet   finasteride (PROSCAR) 5 MG tablet Take 5 mg by mouth daily.   fluticasone (FLONASE) 50 MCG/ACT nasal spray Place 2 sprays into both nostrils daily.   ipratropium (ATROVENT HFA) 17 MCG/ACT inhaler Inhale 2 puffs into the lungs as directed.   ipratropium (ATROVENT) 0.03 % nasal spray SPRAY 2 SPRAYS INTO EACH NOSTRIL THREE TIMES A DAY AS NEEDED   magnesium oxide (MAG-OX) 400 MG tablet Take 400 mg by mouth daily.   methocarbamol (ROBAXIN) 500 MG tablet 1 tablet   metroNIDAZOLE (METROCREAM) 0.75 % cream APPLY SMALL AMOUNT TO AFFECTED AREA DAILY   Multiple Vitamin (MULTI-VITAMINS) TABS Take by mouth daily.    Multiple Vitamins-Minerals (ICAPS AREDS 2 PO) Take by mouth daily.   omeprazole (PRILOSEC) 20 MG capsule Take 1 capsule by mouth daily.   polyethylene glycol (MIRALAX / GLYCOLAX) packet Take 17 g by mouth daily as needed for moderate constipation.   potassium gluconate 595 (99 K) MG TABS tablet 1 tablet   sotalol (BETAPACE) 80 MG tablet TAKE ONE-HALF TABLET BY MOUTH ONCE WEEKLY  Allergies:   Keflex [cephalexin]   Social History   Socioeconomic History   Marital status: Married    Spouse name: Not on file   Number of children: Not on file   Years of education: Not on file   Highest education level: Not on file  Occupational History   Not on file  Tobacco Use   Smoking status: Never   Smokeless tobacco: Never   Tobacco comments:    Never smoke 07/22/21  Vaping Use   Vaping Use: Never used  Substance and Sexual Activity   Alcohol use: Yes    Alcohol/week: 7.0 standard drinks of alcohol    Types: 7 Cans of beer per week    Comment: 1 beer daily 06/23/21   Drug use: No   Sexual activity: Not on file  Other Topics Concern   Not on file  Social History  Narrative   Not on file   Social Determinants of Health   Financial Resource Strain: Not on file  Food Insecurity: Not on file  Transportation Needs: Not on file  Physical Activity: Not on file  Stress: Not on file  Social Connections: Not on file     Family History: The patient's family history includes CAD in his father; Diabetes in his father; Heart attack in his father; Hypertension in his mother; Prostate cancer in his father.  ROS:   Please see the history of present illness.    ROS  All other systems reviewed and negative.   EKGs/Labs/Other Studies Reviewed:    The following studies were reviewed today: Outside labs on [facility]  EKG:  EKG is  ordered today and demonstrates atrial fibrillation with HR 66 bpm and LVH by voltage Recent Labs: 06/23/2021: BUN 25; Creatinine, Ser 1.58; Magnesium 2.2; Potassium 4.4; Sodium 136 10/20/2021: Hemoglobin 12.8; Platelets 229   Recent Lipid Panel    Component Value Date/Time   CHOL 176 09/25/2015 1011   TRIG 111 09/25/2015 1011   HDL 68 09/25/2015 1011   CHOLHDL 2.6 09/25/2015 1011   VLDL 22 09/25/2015 1011   LDLCALC 86 09/25/2015 1011    Physical Exam:    VS:  BP 138/74   Pulse 66   Ht 5' 8.5" (1.74 m)   Wt 174 lb (78.9 kg)   SpO2 98%   BMI 26.07 kg/m     Wt Readings from Last 3 Encounters:  05/19/22 174 lb (78.9 kg)  11/02/21 172 lb (78 kg)  10/20/21 172 lb (78 kg)    GEN: Well nourished, well developed in no acute distress HEENT: Normal NECK: No JVD; No carotid bruits LYMPHATICS: No lymphadenopathy CARDIAC:irregularly irregular, no murmurs, rubs, gallops RESPIRATORY:  Clear to auscultation without rales, wheezing or rhonchi  ABDOMEN: Soft, non-tender, non-distended MUSCULOSKELETAL:  No edema; No deformity  SKIN: Warm and dry NEUROLOGIC:  Alert and oriented x 3 PSYCHIATRIC:  Normal affect  ASSESSMENT:    1. Coronary artery disease involving native coronary artery of native heart without angina pectoris   2.  Paroxysmal atrial fibrillation (HCC)   3. Dyslipidemia   4. Benign essential HTN    PLAN:    In order of problems listed above:  1.  ASCAD -nonobstructive ASCAD with 20% LAD by cath 2011.   -He has not had any anginal symptoms since I saw him last -no ASA due to DOAC -he has refused statin therapy -No beta-blocker due to bradycardia in the past  2.  PAF  -he has been noticing HRs in the 140's  when on the treadmill  and feeling like he was in afib going back as far as Oct 2023 and has been intermittently taking an extra 1/2 tab Sotolol and since the beginning of January he has been taking Sotolol '40mg'$  twice weekly but is still having problems with PAF.  -Continue with drug management with Eliquis 2.5 mg twice daily (reduced dose for age>80 and serum creatinine greater than 1.5) and sotalol 40 mg twice weekly with as needed refills for now and get back in afib clinic to discuss other AADT options -I have personally reviewed and interpreted outside labs performed by patient's PCP which showed serum creatinine 1.47, potassium 4.5 on 09/09/2021 and hemoglobin 12.8 on 10/20/2021 -He has not had any significant bleeding issues  -repeat bmet today  3.  HLD -LDL goal is < 70 but he has refused statin therapy  4.  HTN -BP adequate control on exam -Continue prescription drug management with amlodipine 2.5 mg daily with as needed refills  Followup with me in 1 year  Medication Adjustments/Labs and Tests Ordered: Current medicines are reviewed at length with the patient today.  Concerns regarding medicines are outlined above.  Orders Placed This Encounter  Procedures   EKG 12-Lead   No orders of the defined types were placed in this encounter.   Signed, Fransico Him, MD  05/19/2022 11:41 AM    [place]

## 2022-05-19 NOTE — Patient Instructions (Addendum)
Medication Instructions:  Your physician recommends that you continue on your current medications as directed. Please refer to the Current Medication list given to you today.  *If you need a refill on your cardiac medications before your next appointment, please call your pharmacy*   Lab Work: None.  If you have labs (blood work) drawn today and your tests are completely normal, you will receive your results only by: [facility] (if you have MyChart) OR A paper copy in the mail If you have any lab test that is abnormal or we need to change your treatment, we will call you to review the results.   Testing/Procedures: None.   Follow-Up:  We recommend signing up for the patient portal called "MyChart".  Sign up information is provided on this After Visit Summary.  MyChart is used to connect with patients for Virtual Visits (Telemedicine).  Patients are able to view lab/test results, encounter notes, upcoming appointments, etc.  Non-urgent messages can be sent to your provider as well.   To learn more about what you can do with MyChart, go to [email or web address].    Your next appointment:   November 24, 2022 at 11:00 AM.  Provider:   Fransico Him, MD     Other Instructions You have been referred to the Afib clinic. They will be calling you to schedule an appointment. Their phone number is [phone] and the location is [place] in [place].

## 2022-05-28 DIAGNOSIS — D509 Iron deficiency anemia, unspecified: Secondary | ICD-10-CM | POA: Diagnosis not present

## 2022-05-28 DIAGNOSIS — I129 Hypertensive chronic kidney disease with stage 1 through stage 4 chronic kidney disease, or unspecified chronic kidney disease: Secondary | ICD-10-CM | POA: Diagnosis not present

## 2022-05-28 DIAGNOSIS — G2581 Restless legs syndrome: Secondary | ICD-10-CM | POA: Diagnosis not present

## 2022-05-28 DIAGNOSIS — N1831 Chronic kidney disease, stage 3a: Secondary | ICD-10-CM | POA: Diagnosis not present

## 2022-05-31 DIAGNOSIS — M542 Cervicalgia: Secondary | ICD-10-CM | POA: Diagnosis not present

## 2022-05-31 DIAGNOSIS — M9904 Segmental and somatic dysfunction of sacral region: Secondary | ICD-10-CM | POA: Diagnosis not present

## 2022-05-31 DIAGNOSIS — M9903 Segmental and somatic dysfunction of lumbar region: Secondary | ICD-10-CM | POA: Diagnosis not present

## 2022-05-31 DIAGNOSIS — M9901 Segmental and somatic dysfunction of cervical region: Secondary | ICD-10-CM | POA: Diagnosis not present

## 2022-06-03 DIAGNOSIS — M9903 Segmental and somatic dysfunction of lumbar region: Secondary | ICD-10-CM | POA: Diagnosis not present

## 2022-06-03 DIAGNOSIS — M9904 Segmental and somatic dysfunction of sacral region: Secondary | ICD-10-CM | POA: Diagnosis not present

## 2022-06-03 DIAGNOSIS — M9901 Segmental and somatic dysfunction of cervical region: Secondary | ICD-10-CM | POA: Diagnosis not present

## 2022-06-03 DIAGNOSIS — M542 Cervicalgia: Secondary | ICD-10-CM | POA: Diagnosis not present

## 2022-06-15 ENCOUNTER — Ambulatory Visit (HOSPITAL_COMMUNITY): Payer: Medicare Other | Admitting: Physician Assistant

## 2022-06-21 ENCOUNTER — Encounter (HOSPITAL_COMMUNITY): Payer: Self-pay | Admitting: Physician Assistant

## 2022-06-21 ENCOUNTER — Ambulatory Visit (HOSPITAL_COMMUNITY)
Admission: RE | Admit: 2022-06-21 | Discharge: 2022-06-21 | Disposition: A | Discharge status: Home / Self Care | Payer: Medicare Other | Source: Ambulatory Visit | Attending: Physician Assistant | Admitting: Physician Assistant

## 2022-06-21 VITALS — BP 142/78 | HR 52 | Ht 68.5 in | Wt 172.0 lb

## 2022-06-21 DIAGNOSIS — I4819 Other persistent atrial fibrillation: Secondary | ICD-10-CM | POA: Diagnosis not present

## 2022-06-21 DIAGNOSIS — Z79899 Other long term (current) drug therapy: Secondary | ICD-10-CM | POA: Insufficient documentation

## 2022-06-21 DIAGNOSIS — Z7901 Long term (current) use of anticoagulants: Secondary | ICD-10-CM | POA: Insufficient documentation

## 2022-06-21 DIAGNOSIS — I480 Paroxysmal atrial fibrillation: Secondary | ICD-10-CM | POA: Diagnosis not present

## 2022-06-21 DIAGNOSIS — E785 Hyperlipidemia, unspecified: Secondary | ICD-10-CM | POA: Insufficient documentation

## 2022-06-21 DIAGNOSIS — I2510 Atherosclerotic heart disease of native coronary artery without angina pectoris: Secondary | ICD-10-CM | POA: Diagnosis not present

## 2022-06-21 DIAGNOSIS — D6869 Other thrombophilia: Secondary | ICD-10-CM | POA: Diagnosis not present

## 2022-06-21 DIAGNOSIS — I10 Essential (primary) hypertension: Secondary | ICD-10-CM | POA: Diagnosis not present

## 2022-06-21 MED ORDER — SOTALOL HCL 80 MG PO TABS
40.0000 mg | ORAL_TABLET | ORAL | 3 refills | Status: DC
Start: 2022-06-21 — End: 2022-08-23

## 2022-06-21 NOTE — Progress Notes (Signed)
Primary Care Physician: [name], MD Primary Cardiologist: Dr Radford Pax Primary Electrophysiologist: Dr Caryl Comes (remotely) Referring Physician: Dr Pearla Dubonnet is a 89 y.o. male with a history of CAD, HTN, HLD, atrial fibrillation who presents for follow up in the [facility]. The patient was initially diagnosed with atrial fibrillation remotely and is s/p ablation in 2014. He has been maintained on once weekly sotalol with good success. He did have bradycardia on higher doses of sotalol. Patient is on Eliquis for a CHADS2VASC score of 4.   On follow up today, patient saw Dr Radford Pax on 05/19/22 and stated he was having more frequent elevated heart rates, especially during exercise. He was in rate controlled afib at that visit. Since then, patient started taking a magnesium supplement and taking his sotalol twice weekly and he has not had any further heart racing.   Today, he denies symptoms of palpitations, chest pain, shortness of breath, orthopnea, PND, lower extremity edema, dizziness, presyncope, syncope, snoring, daytime somnolence, bleeding, or neurologic sequela. The patient is tolerating medications without difficulties and is otherwise without complaint today.    Atrial Fibrillation Risk Factors:  he does not have symptoms or diagnosis of sleep apnea. he does not have a history of rheumatic fever. he does have a history of alcohol use. The patient does not have a history of early familial atrial fibrillation or other arrhythmias.  he has a BMI of Body mass index is 25.77 kg/m.Marland Kitchen Filed Weights   06/21/22 1103  Weight: 78 kg    Family History  Problem Relation Age of Onset   Hypertension Mother    Heart attack Father    CAD Father    Diabetes Father    Prostate cancer Father      Atrial Fibrillation Management history:  Previous antiarrhythmic drugs: sotalol Previous cardioversions: none Previous ablations: 2014 CHADS2VASC score:  4 Anticoagulation history: Eliquis   Past Medical History:  Diagnosis Date   Allergic rhinitis    Benign essential HTN    BPH (benign prostatic hypertrophy)    Coronary artery disease 2011   nonobstructive ASCAD with 20% LAD s   Hyperlipidemia    Patient refused statins   PAF (paroxysmal atrial fibrillation) ([place])    CHADS2VASC score 3 on Eliquis    Past Surgical History:  Procedure Laterality Date   CARDIAC CATHETERIZATION  2011   Nonobstructive ASCAD w 20% LAD   COLONOSCOPY  06/15/2010   RADIOFREQUENCY ABLATION  08/23/12   TONSILLECTOMY  1940   as child    Current Outpatient Medications  Medication Sig Dispense Refill   acetaminophen (TYLENOL) 500 MG tablet Take 500 mg by mouth 2 (two) times daily.     amLODipine (NORVASC) 2.5 MG tablet 1 tablet     apixaban (ELIQUIS) 5 MG TABS tablet TAKE ONE-HALF TABLET BY MOUTH TWICE A DAY (CAUTION: BLOOD THINNER)*APPROVED*     B Complex-C (SUPER B COMPLEX/VITAMIN C) TABS Take 1 tablet by mouth daily.     cetirizine (ZYRTEC) 10 MG tablet Take 10 mg by mouth daily.      docusate calcium (SURFAK) 240 MG capsule Take 1 capsule by mouth daily.     Ferrous Sulfate (IRON) 325 (65 Fe) MG TABS 1 tablet     finasteride (PROSCAR) 5 MG tablet Take 5 mg by mouth daily.     fluticasone (FLONASE) 50 MCG/ACT nasal spray Place 2 sprays into both nostrils daily.     ipratropium (ATROVENT HFA) 17 MCG/ACT  inhaler Inhale 2 puffs into the lungs as directed.     ipratropium (ATROVENT) 0.03 % nasal spray SPRAY 2 SPRAYS INTO EACH NOSTRIL THREE TIMES A DAY AS NEEDED     magnesium oxide (MAG-OX) 400 MG tablet Take 400 mg by mouth daily.     methocarbamol (ROBAXIN) 500 MG tablet 1 tablet     metroNIDAZOLE (METROCREAM) 0.75 % cream APPLY SMALL AMOUNT TO AFFECTED AREA DAILY     Multiple Vitamin (MULTI-VITAMINS) TABS Take by mouth daily.      Multiple Vitamins-Minerals (ICAPS AREDS 2 PO) Take by mouth daily.     omeprazole (PRILOSEC) 20 MG capsule Take 1 capsule by  mouth daily.     polyethylene glycol (MIRALAX / GLYCOLAX) packet Take 17 g by mouth daily as needed for moderate constipation.     potassium gluconate 595 (99 K) MG TABS tablet 1 tablet     sotalol (BETAPACE) 80 MG tablet TAKE ONE-HALF TABLET BY MOUTH ONCE WEEKLY 8 tablet 3   No current facility-administered medications for this encounter.    Allergies  Allergen Reactions   Keflex [Cephalexin] Diarrhea, Nausea Only, Other (See Comments) and Nausea And Vomiting    Night sweats also Fever, chills diarrhea Night sweats also Fever, chills diarrhea Night sweats also    Social History   Socioeconomic History   Marital status: Married    Spouse name: Not on file   Number of children: Not on file   Years of education: Not on file   Highest education level: Not on file  Occupational History   Not on file  Tobacco Use   Smoking status: Never   Smokeless tobacco: Never   Tobacco comments:    Never smoke 07/22/21  Vaping Use   Vaping Use: Never used  Substance and Sexual Activity   Alcohol use: Yes    Alcohol/week: 7.0 standard drinks of alcohol    Types: 7 Cans of beer per week    Comment: 1 beer daily 06/23/21   Drug use: No   Sexual activity: Not on file  Other Topics Concern   Not on file  Social History Narrative   Not on file   Social Determinants of Health   Financial Resource Strain: Not on file  Food Insecurity: Not on file  Transportation Needs: Not on file  Physical Activity: Not on file  Stress: Not on file  Social Connections: Not on file  Intimate Partner Violence: Not on file     ROS- All systems are reviewed and negative except as per the HPI above.  Physical Exam: Vitals:   06/21/22 1103  BP: (!) 142/78  Pulse: (!) 52  Weight: 78 kg  Height: 5' 8.5" (1.74 m)     GEN- The patient is a well appearing elderly male, alert and oriented x 3 today.   HEENT-head normocephalic, atraumatic, sclera clear, conjunctiva pink, hearing intact, trachea  midline. Lungs- Clear to ausculation bilaterally, normal work of breathing Heart- Regular rate and rhythm, bradycardia, no murmurs, rubs or gallops  GI- soft, NT, ND, + BS Extremities- no clubbing, cyanosis, or edema MS- no significant deformity or atrophy Skin- no rash or lesion Psych- euthymic mood, full affect Neuro- strength and sensation are intact   Wt Readings from Last 3 Encounters:  06/21/22 78 kg  05/19/22 78.9 kg  11/02/21 78 kg    EKG today demonstrates  SB Vent. rate 52 BPM PR interval 194 ms QRS duration 80 ms QT/QTcB 416/386 ms  Epic records are  reviewed at length today  CHA2DS2-VASc Score = 4  The patient's score is based upon: CHF History: 0 HTN History: 1 Diabetes History: 0 Stroke History: 0 Vascular Disease History: 1 Age Score: 2 Gender Score: 0        ASSESSMENT AND PLAN: 1. Persistent atrial fibrillation  The patient's CHA2DS2-VASc score is 4, indicating a 4.8% annual risk of stroke.   Patient in [place] today, symptoms improved with sotalol twice weekly. We discussed changing AAD to dofetilide. He does not feel his symptoms warrant a change at this time. Amiodarone would likely not be a good option for him given his resting bradycardia. Continue sotalol 40 mg twice weekly.  Continue Eliquis 2.5 mg BID  2. Secondary Hypercoagulable State (ICD10:  D68.69) The patient is at significant risk for stroke/thromboembolism based upon his CHA2DS2-VASc Score of 4.  Continue Apixaban (Eliquis).   3. HTN Stable, no changes today.  4. CAD Declined statin therapy No anginal symptoms.   Follow up in the AF clinic in 6 months.    [facility] [address] [place], [place] [address] [phone] 06/21/2022 11:08 AM

## 2022-07-02 DIAGNOSIS — M9904 Segmental and somatic dysfunction of sacral region: Secondary | ICD-10-CM | POA: Diagnosis not present

## 2022-07-02 DIAGNOSIS — M9901 Segmental and somatic dysfunction of cervical region: Secondary | ICD-10-CM | POA: Diagnosis not present

## 2022-07-02 DIAGNOSIS — M9903 Segmental and somatic dysfunction of lumbar region: Secondary | ICD-10-CM | POA: Diagnosis not present

## 2022-07-02 DIAGNOSIS — M542 Cervicalgia: Secondary | ICD-10-CM | POA: Diagnosis not present

## 2022-07-08 DIAGNOSIS — M542 Cervicalgia: Secondary | ICD-10-CM | POA: Diagnosis not present

## 2022-07-08 DIAGNOSIS — M9901 Segmental and somatic dysfunction of cervical region: Secondary | ICD-10-CM | POA: Diagnosis not present

## 2022-07-08 DIAGNOSIS — M9903 Segmental and somatic dysfunction of lumbar region: Secondary | ICD-10-CM | POA: Diagnosis not present

## 2022-07-08 DIAGNOSIS — M9904 Segmental and somatic dysfunction of sacral region: Secondary | ICD-10-CM | POA: Diagnosis not present

## 2022-07-19 ENCOUNTER — Telehealth: Payer: Self-pay | Admitting: Cardiology

## 2022-07-19 NOTE — Telephone Encounter (Signed)
Pt c/o medication issue:  1. Name of Medication: Sotalol   2. How are you currently taking this medication (dosage and times per day)?   3. Are you having a reaction (difficulty breathing--STAT)?   4. What is your medication issue? Patient wants to take his medicine in the morning

## 2022-07-19 NOTE — Telephone Encounter (Signed)
Pt seen 06/21/22 by Marlene Lard at [facility]:  1. Persistent atrial fibrillation  The patient's CHA2DS2-VASc score is 4, indicating a 4.8% annual risk of stroke.   Patient in [place] today, symptoms improved with sotalol twice weekly. We discussed changing AAD to dofetilide. He does not feel his symptoms warrant a change at this time. Amiodarone would likely not be a good option for him given his resting bradycardia. Continue sotalol 40 mg twice weekly.  Continue Eliquis 2.5 mg BID  Returned call to patient who states that he has been having more frequent episodes of A-fib over the last month. He denies any symptoms of this, specifically denies SOB, chest pain, dizziness. States he is still going to the gym for exercising and his only complaint is that "I'm going in and out of A-fib." Educated patient that there is no cure for A-fib, only treatment and the fact that he can tell when he goes into and out of is NOT a symptom. He states he wants to begin taking Sotalol each morning to keep himself from going into a-fib. Educated that he cannot do this as BB's are used for rate control and at the twice weekly dosing (which he verifies is what he's currently doing) his HR was only 52 at A-fib clinic visit on 06/21/22. Pt insists "well what I'm doing is not working" but again denies any symptoms. Felt we kept going in circles on the same subject matter, so instructed him I would forward to A-fib clinic to see if they can help him understand better.

## 2022-07-19 NOTE — Telephone Encounter (Signed)
Spoke with patient reviewed recommendations from recent visit. Patient is not interested in hospitalization for tikosyn start and would prefer to continue current medications. Instructed pt to call afib clinic should he decide to proceed with Tikosyn. Pt in agreement.

## 2022-07-21 DIAGNOSIS — H353111 Nonexudative age-related macular degeneration, right eye, early dry stage: Secondary | ICD-10-CM | POA: Diagnosis not present

## 2022-07-21 DIAGNOSIS — Z961 Presence of intraocular lens: Secondary | ICD-10-CM | POA: Diagnosis not present

## 2022-07-21 DIAGNOSIS — H318 Other specified disorders of choroid: Secondary | ICD-10-CM | POA: Diagnosis not present

## 2022-07-21 DIAGNOSIS — H35373 Puckering of macula, bilateral: Secondary | ICD-10-CM | POA: Diagnosis not present

## 2022-07-21 DIAGNOSIS — H35363 Drusen (degenerative) of macula, bilateral: Secondary | ICD-10-CM | POA: Diagnosis not present

## 2022-07-21 DIAGNOSIS — H26491 Other secondary cataract, right eye: Secondary | ICD-10-CM | POA: Diagnosis not present

## 2022-08-22 ENCOUNTER — Other Ambulatory Visit: Payer: Self-pay | Admitting: Cardiology

## 2022-09-13 DIAGNOSIS — M9904 Segmental and somatic dysfunction of sacral region: Secondary | ICD-10-CM | POA: Diagnosis not present

## 2022-09-13 DIAGNOSIS — M9903 Segmental and somatic dysfunction of lumbar region: Secondary | ICD-10-CM | POA: Diagnosis not present

## 2022-09-13 DIAGNOSIS — M9901 Segmental and somatic dysfunction of cervical region: Secondary | ICD-10-CM | POA: Diagnosis not present

## 2022-09-13 DIAGNOSIS — M542 Cervicalgia: Secondary | ICD-10-CM | POA: Diagnosis not present

## 2022-09-21 DIAGNOSIS — M542 Cervicalgia: Secondary | ICD-10-CM | POA: Diagnosis not present

## 2022-09-21 DIAGNOSIS — M9901 Segmental and somatic dysfunction of cervical region: Secondary | ICD-10-CM | POA: Diagnosis not present

## 2022-09-21 DIAGNOSIS — M9903 Segmental and somatic dysfunction of lumbar region: Secondary | ICD-10-CM | POA: Diagnosis not present

## 2022-09-21 DIAGNOSIS — M9904 Segmental and somatic dysfunction of sacral region: Secondary | ICD-10-CM | POA: Diagnosis not present

## 2022-09-29 DIAGNOSIS — G2581 Restless legs syndrome: Secondary | ICD-10-CM | POA: Diagnosis not present

## 2022-09-29 DIAGNOSIS — Z0000 Encounter for general adult medical examination without abnormal findings: Secondary | ICD-10-CM | POA: Diagnosis not present

## 2022-09-29 DIAGNOSIS — D6869 Other thrombophilia: Secondary | ICD-10-CM | POA: Diagnosis not present

## 2022-09-29 DIAGNOSIS — I480 Paroxysmal atrial fibrillation: Secondary | ICD-10-CM | POA: Diagnosis not present

## 2022-09-29 DIAGNOSIS — D509 Iron deficiency anemia, unspecified: Secondary | ICD-10-CM | POA: Diagnosis not present

## 2022-09-29 DIAGNOSIS — J309 Allergic rhinitis, unspecified: Secondary | ICD-10-CM | POA: Diagnosis not present

## 2022-09-29 DIAGNOSIS — I129 Hypertensive chronic kidney disease with stage 1 through stage 4 chronic kidney disease, or unspecified chronic kidney disease: Secondary | ICD-10-CM | POA: Diagnosis not present

## 2022-09-29 DIAGNOSIS — I2510 Atherosclerotic heart disease of native coronary artery without angina pectoris: Secondary | ICD-10-CM | POA: Diagnosis not present

## 2022-09-29 DIAGNOSIS — Z1331 Encounter for screening for depression: Secondary | ICD-10-CM | POA: Diagnosis not present

## 2022-09-29 DIAGNOSIS — N1831 Chronic kidney disease, stage 3a: Secondary | ICD-10-CM | POA: Diagnosis not present

## 2022-09-29 DIAGNOSIS — Z23 Encounter for immunization: Secondary | ICD-10-CM | POA: Diagnosis not present

## 2022-09-29 DIAGNOSIS — L719 Rosacea, unspecified: Secondary | ICD-10-CM | POA: Diagnosis not present

## 2022-10-20 ENCOUNTER — Emergency Department (HOSPITAL_BASED_OUTPATIENT_CLINIC_OR_DEPARTMENT_OTHER): Payer: Medicare Other

## 2022-10-20 ENCOUNTER — Emergency Department (HOSPITAL_BASED_OUTPATIENT_CLINIC_OR_DEPARTMENT_OTHER)
Admission: EM | Admit: 2022-10-20 | Discharge: 2022-10-20 | Disposition: A | Discharge status: Home / Self Care | Payer: Medicare Other | Attending: Emergency Medicine | Admitting: Emergency Medicine

## 2022-10-20 ENCOUNTER — Encounter (HOSPITAL_BASED_OUTPATIENT_CLINIC_OR_DEPARTMENT_OTHER): Payer: Self-pay

## 2022-10-20 ENCOUNTER — Other Ambulatory Visit: Payer: Self-pay

## 2022-10-20 DIAGNOSIS — Z7901 Long term (current) use of anticoagulants: Secondary | ICD-10-CM | POA: Insufficient documentation

## 2022-10-20 DIAGNOSIS — Z79899 Other long term (current) drug therapy: Secondary | ICD-10-CM | POA: Insufficient documentation

## 2022-10-20 DIAGNOSIS — I2510 Atherosclerotic heart disease of native coronary artery without angina pectoris: Secondary | ICD-10-CM | POA: Diagnosis not present

## 2022-10-20 DIAGNOSIS — I10 Essential (primary) hypertension: Secondary | ICD-10-CM | POA: Diagnosis not present

## 2022-10-20 DIAGNOSIS — R29898 Other symptoms and signs involving the musculoskeletal system: Secondary | ICD-10-CM | POA: Diagnosis not present

## 2022-10-20 DIAGNOSIS — R531 Weakness: Secondary | ICD-10-CM | POA: Insufficient documentation

## 2022-10-20 DIAGNOSIS — Z471 Aftercare following joint replacement surgery: Secondary | ICD-10-CM | POA: Diagnosis not present

## 2022-10-20 DIAGNOSIS — U071 COVID-19: Secondary | ICD-10-CM | POA: Diagnosis not present

## 2022-10-20 DIAGNOSIS — R519 Headache, unspecified: Secondary | ICD-10-CM | POA: Diagnosis present

## 2022-10-20 DIAGNOSIS — I700 Atherosclerosis of aorta: Secondary | ICD-10-CM | POA: Diagnosis not present

## 2022-10-20 LAB — CBC WITH DIFFERENTIAL/PLATELET
Abs Immature Granulocytes: 0.06 10*3/uL (ref 0.00–0.07)
Basophils Absolute: 0 10*3/uL (ref 0.0–0.1)
Basophils Relative: 0 %
Eosinophils Absolute: 0 10*3/uL (ref 0.0–0.5)
Eosinophils Relative: 0 %
HCT: 35.6 % — ABNORMAL LOW (ref 39.0–52.0)
Hemoglobin: 11.8 g/dL — ABNORMAL LOW (ref 13.0–17.0)
Immature Granulocytes: 1 %
Lymphocytes Relative: 3 %
Lymphs Abs: 0.3 10*3/uL — ABNORMAL LOW (ref 0.7–4.0)
MCH: 31 pg (ref 26.0–34.0)
MCHC: 33.1 g/dL (ref 30.0–36.0)
MCV: 93.4 fL (ref 80.0–100.0)
Monocytes Absolute: 0.8 10*3/uL (ref 0.1–1.0)
Monocytes Relative: 8 %
Neutro Abs: 8.6 10*3/uL — ABNORMAL HIGH (ref 1.7–7.7)
Neutrophils Relative %: 88 %
Platelets: 189 10*3/uL (ref 150–400)
RBC: 3.81 MIL/uL — ABNORMAL LOW (ref 4.22–5.81)
RDW: 12 % (ref 11.5–15.5)
WBC: 9.8 10*3/uL (ref 4.0–10.5)
nRBC: 0 % (ref 0.0–0.2)

## 2022-10-20 LAB — URINALYSIS, ROUTINE W REFLEX MICROSCOPIC
Bilirubin Urine: NEGATIVE
Glucose, UA: NEGATIVE mg/dL
Hgb urine dipstick: NEGATIVE
Ketones, ur: NEGATIVE mg/dL
Leukocytes,Ua: NEGATIVE
Nitrite: NEGATIVE
Protein, ur: 30 mg/dL — AB
Specific Gravity, Urine: 1.02 (ref 1.005–1.030)
pH: 7 (ref 5.0–8.0)

## 2022-10-20 LAB — URINALYSIS, MICROSCOPIC (REFLEX): WBC, UA: NONE SEEN WBC/hpf (ref 0–5)

## 2022-10-20 LAB — TROPONIN I (HIGH SENSITIVITY)
Troponin I (High Sensitivity): 7 ng/L (ref <18)
Troponin I (High Sensitivity): 8 ng/L (ref <18)

## 2022-10-20 LAB — COMPREHENSIVE METABOLIC PANEL
ALT: 16 U/L (ref 0–44)
AST: 30 U/L (ref 15–41)
Albumin: 4 g/dL (ref 3.5–5.0)
Alkaline Phosphatase: 60 U/L (ref 38–126)
Anion gap: 10 (ref 5–15)
BUN: 23 mg/dL (ref 8–23)
CO2: 20 mmol/L — ABNORMAL LOW (ref 22–32)
Calcium: 8.8 mg/dL — ABNORMAL LOW (ref 8.9–10.3)
Chloride: 103 mmol/L (ref 98–111)
Creatinine, Ser: 1.57 mg/dL — ABNORMAL HIGH (ref 0.61–1.24)
GFR, Estimated: 41 mL/min — ABNORMAL LOW (ref >60)
Glucose, Bld: 148 mg/dL — ABNORMAL HIGH (ref 70–99)
Potassium: 4.1 mmol/L (ref 3.5–5.1)
Sodium: 133 mmol/L — ABNORMAL LOW (ref 135–145)
Total Bilirubin: 1 mg/dL (ref 0.3–1.2)
Total Protein: 7.2 g/dL (ref 6.5–8.1)

## 2022-10-20 LAB — RESP PANEL BY RT-PCR (RSV, FLU A&B, COVID)  RVPGX2
Influenza A by PCR: NEGATIVE
Influenza B by PCR: NEGATIVE
Resp Syncytial Virus by PCR: NEGATIVE
SARS Coronavirus 2 by RT PCR: POSITIVE — AB

## 2022-10-20 LAB — MAGNESIUM: Magnesium: 2 mg/dL (ref 1.7–2.4)

## 2022-10-20 MED ORDER — MOLNUPIRAVIR EUA 200MG CAPSULE: 4.0000 | ORAL_CAPSULE | Freq: Two times a day (BID) | ORAL | 0 refills | Status: DC

## 2022-10-20 MED ORDER — IOHEXOL 350 MG/ML SOLN
60.0000 mL | Freq: Once | INTRAVENOUS | Status: AC | PRN
  Administered 2022-10-20: 60 mL via INTRAVENOUS

## 2022-10-20 NOTE — ED Provider Notes (Signed)
[facility] Provider Note   CSN: [id number] Arrival date & time: 10/20/22  1623     History  Chief Complaint  Patient presents with   Weakness    George Cain is a 89 y.o. male with afib on eliquis, HLD, HTN, CAD who presents with c/o generalized weakness that started around 10:30 am today while walking in [employer]. States he was "stumbling." LKN was 10:30 AM. No h/o similar. Per pt, he was walking around [employer] and had a sudden onset of bilateral leg weakness and difficulty walking. States that he "couldn't control his arms or legs." Pt denies focal weakness, sensation deficit, difficulty speaking, or facial droop. Per pt, he usually walks without a walker. Pt endorses a headache that started today. States he couldn't stand up out of his chair because his bilateral legs were so weak. Denies numbness/tingling, slurred speech, facial droop, lightheadedness, chest pain, urinary symptoms, recent changes to medicine, drug use. Had one beer today. Endorses cold-like symptoms that began last night including runny nose, cough. Had complained of headache in [employer] but now has no headache; wasn't thunderclap or severe.    Weakness      Home Medications Prior to Admission medications   Medication Sig Start Date End Date Taking? Authorizing Provider  molnupiravir EUA (LAGEVRIO) 200 mg CAPS capsule Take 4 capsules (800 mg total) by mouth 2 (two) times daily for 5 days. 10/20/22 10/25/22 Yes Loetta Rough, MD  acetaminophen (TYLENOL) 500 MG tablet Take 500 mg by mouth 2 (two) times daily.    [provider]  amLODipine (NORVASC) 2.5 MG tablet 1 tablet 03/29/21   [provider]  apixaban (ELIQUIS) 5 MG TABS tablet TAKE ONE-HALF TABLET BY MOUTH TWICE A DAY (CAUTION: BLOOD THINNER)*APPROVED* 04/30/21   [provider]  B Complex-C (SUPER B COMPLEX/VITAMIN C) TABS Take 1 tablet by mouth daily.    [provider]   cetirizine (ZYRTEC) 10 MG tablet Take 10 mg by mouth daily.     [provider]  docusate calcium (SURFAK) 240 MG capsule Take 1 capsule by mouth daily. 06/18/13   [provider]  Ferrous Sulfate (IRON) 325 (65 Fe) MG TABS 1 tablet    [provider]  finasteride (PROSCAR) 5 MG tablet Take 5 mg by mouth daily.    [provider]  fluticasone (FLONASE) 50 MCG/ACT nasal spray Place 2 sprays into both nostrils daily.    [provider]  ipratropium (ATROVENT HFA) 17 MCG/ACT inhaler Inhale 2 puffs into the lungs as directed.    [provider]  ipratropium (ATROVENT) 0.03 % nasal spray SPRAY 2 SPRAYS INTO EACH NOSTRIL THREE TIMES A DAY AS NEEDED 06/02/20   [provider]  magnesium oxide (MAG-OX) 400 MG tablet Take 400 mg by mouth daily.    [provider]  methocarbamol (ROBAXIN) 500 MG tablet 1 tablet    [provider]  metroNIDAZOLE (METROCREAM) 0.75 % cream APPLY SMALL AMOUNT TO AFFECTED AREA DAILY 06/02/20   [provider]  Multiple Vitamin (MULTI-VITAMINS) TABS Take by mouth daily.     [provider]  Multiple Vitamins-Minerals (ICAPS AREDS 2 PO) Take by mouth daily.    [provider]  omeprazole (PRILOSEC) 20 MG capsule Take 1 capsule by mouth daily. 05/05/20   [provider]  polyethylene glycol (MIRALAX / GLYCOLAX) packet Take 17 g by mouth daily as needed for moderate constipation.    [provider]  potassium gluconate 595 (99 K) MG TABS tablet 1 tablet    [provider]  sotalol (BETAPACE) 80 MG tablet Take 0.5 tablets (40 mg total) by mouth 2 (two) times a week. 08/23/22   Fenton, Clint R, PA      Allergies    Keflex [cephalexin]    Review of Systems   Review of Systems  Neurological:  Positive for weakness.   A 10 point review of systems was performed and is negative unless otherwise reported in HPI.  Physical Exam Updated Vital Signs BP (!)  149/72 (BP Location: Left Arm)   Pulse 99   Temp 99 F (37.2 C) (Oral)   Resp 18   Wt 79.4 kg   SpO2 97%   BMI 26.22 kg/m  Physical Exam General: Normal appearing elderly male, lying in bed.  HEENT: PERRLA, Sclera anicteric, MMM, trachea midline.  Cardiology: RRR, no murmurs/rubs/gallops. BL radial and DP pulses equal bilaterally.  Resp: Normal respiratory rate and effort. CTAB, no wheezes, rhonchi, crackles.  Abd: Soft, non-tender, non-distended. No rebound tenderness or guarding.  GU: Deferred. MSK: No peripheral edema or signs of trauma. Extremities without deformity or TTP. No cyanosis or clubbing. Skin: warm, dry. No rashes or lesions. Back: No CVA tenderness Neuro: A&Ox4, CNs II-XII grossly intact. 5/5 grip strength, shoulder shrug. 5/5 bilateral plantar/dorsiflexion, knee flexion/extension, and hip flexion/extension. Sensation grossly intact. Gait shuffling and slow but patient states is baseline. Normal speech. Normal FTN.  Psych: Normal mood and affect.   1a  Level of consciousness: 0=alert; keenly responsive  1b. LOC questions:  0=Performs both tasks correctly  1c. LOC commands: 0=Performs both tasks correctly  2.  Best Gaze: 0=normal  3.  Visual: 0=No visual loss  4. Facial Palsy: 0=Normal symmetric movement  5a.  Motor left arm: 0=No drift, limb holds 90 (or 45) degrees for full 10 seconds  5b.  Motor right arm: 0=No drift, limb holds 90 (or 45) degrees for full 10 seconds  6a. motor left leg: 0=No drift, limb holds 90 (or 45) degrees for full 10 seconds  6b  Motor right leg:  0=No drift, limb holds 90 (or 45) degrees for full 10 seconds  7. Limb Ataxia: 0=Absent  8.  Sensory: 0=Normal; no sensory loss  9. Best Language:  0=No aphasia, normal  10. Dysarthria: 0=Normal  11. Extinction and Inattention: 0=No abnormality   Total:   0        ED Results / Procedures / Treatments   Labs (all labs ordered are listed, but only abnormal results are displayed) Labs  Reviewed  RESP PANEL BY RT-PCR (RSV, FLU A&B, COVID)  RVPGX2 - Abnormal; Notable for the following components:      Result Value   SARS Coronavirus 2 by RT PCR POSITIVE (*)    All other components within normal limits  CBC WITH DIFFERENTIAL/PLATELET - Abnormal; Notable for the following components:   RBC 3.81 (*)    Hemoglobin 11.8 (*)    HCT 35.6 (*)    Neutro Abs 8.6 (*)    Lymphs Abs 0.3 (*)    All other components within normal limits  COMPREHENSIVE METABOLIC PANEL - Abnormal; Notable for the following components:   Sodium 133 (*)    CO2 20 (*)    Glucose, Bld 148 (*)    Creatinine, Ser 1.57 (*)    Calcium 8.8 (*)    GFR, Estimated 41 (*)    All other components within normal limits  URINALYSIS, ROUTINE  W REFLEX MICROSCOPIC - Abnormal; Notable for the following components:   Protein, ur 30 (*)    All other components within normal limits  URINALYSIS, MICROSCOPIC (REFLEX) - Abnormal; Notable for the following components:   Bacteria, UA RARE (*)    All other components within normal limits  MAGNESIUM  TROPONIN I (HIGH SENSITIVITY)  TROPONIN I (HIGH SENSITIVITY)    EKG EKG Interpretation  Date/Time:  Wednesday October 20 2022 16:35:24 EDT Ventricular Rate:  105 PR Interval:    QRS Duration: 94 QT Interval:  340 QTC Calculation: 450 R Axis:   7 Text Interpretation: Atrial fibrillation Abnormal R-wave progression, early transition Confirmed by Vivi Barrack [phone]) on 10/20/2022 5:04:57 PM  Radiology CT ANGIO HEAD NECK W WO CM  Result Date: 10/20/2022 CLINICAL DATA:  Generalized weakness, bilateral leg weakness, difficulty walking EXAM: CT ANGIOGRAPHY HEAD AND NECK WITH AND WITHOUT CONTRAST TECHNIQUE: Multidetector CT imaging of the head and neck was performed using the standard protocol during bolus administration of intravenous contrast. Multiplanar CT image reconstructions and MIPs were obtained to evaluate the vascular anatomy. Carotid stenosis measurements (when  applicable) are obtained utilizing NASCET criteria, using the distal internal carotid diameter as the denominator. RADIATION DOSE REDUCTION: This exam was performed according to the departmental dose-optimization program which includes automated exposure control, adjustment of the mA and/or kV according to patient size and/or use of iterative reconstruction technique. CONTRAST:  60mL OMNIPAQUE IOHEXOL 350 MG/ML SOLN COMPARISON:  None Available. FINDINGS: CT HEAD FINDINGS Brain: No evidence of acute infarct, hemorrhage, mass, mass effect, or midline shift. No hydrocephalus or extra-axial fluid collection. Periventricular white matter changes, likely the sequela of chronic small vessel ischemic disease. Vascular: No hyperdense vessel. Skull: Negative for fracture or focal lesion. Sinuses/Orbits: Mucosal thickening in the left maxillary sinus and bilateral ethmoid air cells. Status post bilateral lens replacements. Other: The mastoid air cells are well aerated. CTA NECK FINDINGS Aortic arch: Standard branching. Imaged portion shows no evidence of aneurysm or dissection. No significant stenosis of the major arch vessel origins. Mild aortic atherosclerosis. Right carotid system: No evidence of dissection, occlusion, or hemodynamically significant stenosis (greater than 50%). Left carotid system: No evidence of dissection, occlusion, or hemodynamically significant stenosis (greater than 50%). Vertebral arteries: No evidence of dissection, occlusion, or hemodynamically significant stenosis (greater than 50%). Skeleton: No acute osseous abnormality. Degenerative changes in the cervical spine. Other neck: No acute finding. Upper chest: No focal pulmonary opacity or pleural effusion. Review of the MIP images confirms the above findings CTA HEAD FINDINGS Anterior circulation: Both internal carotid arteries are patent to the termini, without significant stenosis. A1 segments patent. Normal anterior communicating artery.  Anterior cerebral arteries are patent to their distal aspects without significant stenosis. No M1 stenosis or occlusion. MCA branches perfused to their distal aspects without significant stenosis. Posterior circulation: Vertebral arteries patent to the vertebrobasilar junction without significant stenosis. Posterior inferior cerebellar arteries patent proximally. Basilar patent to its distal aspect without significant stenosis. Superior cerebellar arteries patent proximally. Patent P1 segments, hypoplastic on the left. Near fetal origin of the left PCA from the left posterior communicating artery. PCAs perfused to their distal aspects without significant stenosis. The right posterior communicating artery is not visualized. Venous sinuses: As permitted by contrast timing, patent. Anatomic variants: None significant. Review of the MIP images confirms the above findings IMPRESSION: 1. No acute intracranial process. 2. No intracranial large vessel occlusion or significant stenosis. 3. No hemodynamically significant stenosis in the neck. 4. Aortic atherosclerosis.  Aortic Atherosclerosis (ICD10-I70.0). Electronically Signed   By: Wiliam Ke M.D.   On: 10/20/2022 19:23   DG Chest 2 View  Result Date: 10/20/2022 CLINICAL DATA:  Cough EXAM: CHEST - 2 VIEW COMPARISON:  X-ray 01/10/2013. FINDINGS: No consolidation, pneumothorax or effusion. Normal cardiopericardial silhouette. Film is slightly rotated to the left. Hyperinflation. Sclerotic lesion involving the shaft of the left humerus at the edge of the imaging field. Based on appearance this could represent a chondroid lesion. Please correlate with any history or prior imaging otherwise confirmatory evaluation when clinically appropriate IMPRESSION: Hyperinflation.  No acute cardiopulmonary disease. Presumed chondroid lesion involving the shaft of the left humerus. Please correlate with any symptoms and would recommend correlation to prior imaging to assess stability  Electronically Signed   By: Karen Kays M.D.   On: 10/20/2022 17:22    Procedures Procedures    Medications Ordered in ED Medications  iohexol (OMNIPAQUE) 350 MG/ML injection 60 mL (60 mLs Intravenous Contrast Given 10/20/22 1827)    ED Course/ Medical Decision Making/ A&P                          Medical Decision Making Amount and/or Complexity of Data Reviewed Labs: ordered. Decision-making details documented in ED Course. Radiology: ordered. Decision-making details documented in ED Course.  Risk Prescription drug management.    This patient presents to the ED for concern of generalized weakness/off balance sensation, this involves an extensive number of treatment options, and is a complaint that carries with it a high risk of complications and morbidity.  I considered the following differential and admission for this acute, potentially life threatening condition.   MDM:    DDX for generalized weakness includes but is not limited to:  Infectious processes such as viral URI given sxs, PNA, or UTI; severe metabolic derangements or electrolyte abnormalities, ischemia/ACS, heart failure, anemia, and intracranial/central processes.  Patient also reports that he felt very off balance, like he could not control his arms or legs.  This could be concerning for an ataxia that could be caused by CVA. He did complain of mild headache while in [employer] but has no headache now, wasn't thunderclap/severe. On walking of the patient and gait he states that his gait feels back to normal though as he gets back into bed he feels "a little bit dizzy."  He is out of the window for TNK and has no FNDs at this time, NIHSS 0, do not believe patient needs to be stroke-coded right now but will evaluate with CTH and CTA H&N.    Clinical Course as of 10/20/22 2106  Wed Oct 20, 2022  1745 Troponin I (High Sensitivity): 7 wnl [HN]  1745 DG Chest 2 View Hyperinflation.  No acute cardiopulmonary  disease.  Presumed chondroid lesion involving the shaft of the left humerus. Please correlate with any symptoms and would recommend correlation to prior imaging to assess stability   [HN]  1745 WBC: 9.8 No leukocytosis  [HN]  1745 Hemoglobin(!): 11.8 No new anemia (BL 11.7-12.8) [HN]  1746 Creatinine(!): 1.57 Stable from one year ago [HN]  1957 SARS Coronavirus 2 by RT PCR(!): POSITIVE +COVID, which could explain patient's viral URI symptoms as well as his generalized weakness [HN]  1957 CT ANGIO HEAD NECK W WO CM Without abnormalities [HN]  2016 Troponin I (High Sensitivity): 8 Repeat trop flat, negative [HN]  2016 Urinalysis, Routine w reflex microscopic -Urine, Clean Catch(!) No UTI [HN]  2045  Patient with very reassuring workup and positive COVID test.  This is likely the cause of his symptoms, given that he has nasal congestion, cough, and dizziness, has symptomatic COVID.  Patient will be discharged with molnupiravir prescription given his age and risk for progression to severe COVID.  Patient is informed of his findings and he states that he feels ready to be discharged.  I considered admission for this patient believe he is stable for outpatient follow-up.  Instructed to follow-up with PCP within 1 to 2 weeks.  Given specific discharge instructions and return precautions.  All questions answered to patient satisfaction. [HN]    Clinical Course User Index [HN] Loetta Rough, MD    Labs: I Ordered, and personally interpreted labs.  The pertinent results include:  those listed above  Imaging Studies ordered: I ordered imaging studies including CTH I independently visualized and interpreted imaging. I agree with the radiologist interpretation  Additional history obtained from friend at bedside, chart review.    Cardiac Monitoring: The patient was maintained on a cardiac monitor.  I personally viewed and interpreted the cardiac monitored which showed an underlying rhythm  of: rate controlled Afib  Reevaluation: I reevaluated the patient and found that they have :improved  Social Determinants of Health: Patient lives independently   Disposition:  DC w/ discharge instructions/return precautions. All questions answered to patient's satisfaction.    Co morbidities that complicate the patient evaluation  Past Medical History:  Diagnosis Date   Allergic rhinitis    Benign essential HTN    BPH (benign prostatic hypertrophy)    Coronary artery disease 2011   nonobstructive ASCAD with 20% LAD s   Hyperlipidemia    Patient refused statins   PAF (paroxysmal atrial fibrillation) (HCC)    CHADS2VASC score 3 on Eliquis      Medicines Meds ordered this encounter  Medications   iohexol (OMNIPAQUE) 350 MG/ML injection 60 mL   molnupiravir EUA (LAGEVRIO) 200 mg CAPS capsule    Sig: Take 4 capsules (800 mg total) by mouth 2 (two) times daily for 5 days.    Dispense:  40 capsule    Refill:  0    I have reviewed the patients home medicines and have made adjustments as needed  Problem List / ED Course: Problem List Items Addressed This Visit   None Visit Diagnoses     COVID-19    -  Primary   Relevant Medications   molnupiravir EUA (LAGEVRIO) 200 mg CAPS capsule   Generalized weakness                       This note was created using dictation software, which may contain spelling or grammatical errors.    Loetta Rough, MD 10/20/22 2106

## 2022-10-20 NOTE — Discharge Instructions (Addendum)
Thank you for coming to [facility] Emergency Department. You were seen for generalized weakness, flu-like symptoms. We did an exam, labs, and imaging, and these showed covid-19 infection.  We have prescribed Lagevrio (molnupiravir) to take twice per day for 5 days to help prevent the progression to severe covid.  Please follow up with your primary care provider within 1 week.   Do not hesitate to return to the ED or call 911 if you experience: -Worsening symptoms -Chest pain, shortness of breath -Asymmetric weakness, slurred speech, facial droop, numbness/tingling, confusion -Falls -Lightheadedness, passing out -Fevers/chills -Anything else that concerns you

## 2022-10-20 NOTE — ED Triage Notes (Signed)
Pt arrives with c/o generalized weakness that started around 12pm today. Per pt, he was walking around [employer] and had a sudden onset of bilateral leg weakness and difficulty walking. Pt denies focal weakness, sensation deficit, difficulty speaking, or facial droop. Per pt, he usually walks without a walker. Pt endorses a headache that started today while in [employer]. RN observed pt leaning to the left and pt states that is abnormal for him.

## 2022-10-21 ENCOUNTER — Telehealth (HOSPITAL_BASED_OUTPATIENT_CLINIC_OR_DEPARTMENT_OTHER): Payer: Self-pay | Admitting: Student

## 2022-10-21 DIAGNOSIS — U071 COVID-19: Secondary | ICD-10-CM | POA: Diagnosis not present

## 2022-10-21 MED ORDER — MOLNUPIRAVIR EUA 200MG CAPSULE: 4.0000 | ORAL_CAPSULE | Freq: Two times a day (BID) | ORAL | 0 refills | Status: AC

## 2022-10-21 NOTE — Telephone Encounter (Signed)
Patient seen in the ED last night and diagnosed with COVID infection, prescribed molnupiravir.  Patient called this morning reporting that the [facility] pharmacy the prescription was sent to did not have it in stock, prescription resent to local [facility] that has medication.

## 2022-11-02 DIAGNOSIS — B9689 Other specified bacterial agents as the cause of diseases classified elsewhere: Secondary | ICD-10-CM | POA: Diagnosis not present

## 2022-11-02 DIAGNOSIS — L0202 Furuncle of face: Secondary | ICD-10-CM | POA: Diagnosis not present

## 2022-11-02 DIAGNOSIS — L820 Inflamed seborrheic keratosis: Secondary | ICD-10-CM | POA: Diagnosis not present

## 2022-11-18 DIAGNOSIS — M79671 Pain in right foot: Secondary | ICD-10-CM | POA: Diagnosis not present

## 2022-11-22 ENCOUNTER — Encounter: Payer: Self-pay | Admitting: Cardiology

## 2022-11-22 ENCOUNTER — Ambulatory Visit: Payer: Medicare Other | Attending: Cardiology | Admitting: Cardiology

## 2022-11-22 VITALS — BP 146/86 | HR 66 | Ht 68.5 in | Wt 171.8 lb

## 2022-11-22 DIAGNOSIS — I2510 Atherosclerotic heart disease of native coronary artery without angina pectoris: Secondary | ICD-10-CM

## 2022-11-22 DIAGNOSIS — I480 Paroxysmal atrial fibrillation: Secondary | ICD-10-CM | POA: Diagnosis not present

## 2022-11-22 DIAGNOSIS — E785 Hyperlipidemia, unspecified: Secondary | ICD-10-CM | POA: Diagnosis not present

## 2022-11-22 DIAGNOSIS — Z79899 Other long term (current) drug therapy: Secondary | ICD-10-CM | POA: Diagnosis present

## 2022-11-22 DIAGNOSIS — I10 Essential (primary) hypertension: Secondary | ICD-10-CM | POA: Diagnosis not present

## 2022-11-22 NOTE — Addendum Note (Signed)
Addended by: Luellen Pucker on: 11/22/2022 02:20 PM   Modules accepted: Orders

## 2022-11-22 NOTE — Patient Instructions (Addendum)
Medication Instructions:  Your physician recommends that you continue on your current medications as directed. Please refer to the Current Medication list given to you today.   *If you need a refill on your cardiac medications before your next appointment, please call your pharmacy*   Lab Work: Please schedule a day to complete a FASTING lipid panel and an ALT.  If you have labs (blood work) drawn today and your tests are completely normal, you will receive your results only by: MyChart Message (if you have MyChart) OR A paper copy in the mail If you have any lab test that is abnormal or we need to change your treatment, we will call you to review the results.   Testing/Procedures: None.   Follow-Up: At [facility], you and your health needs are our priority.  As part of our continuing mission to provide you with exceptional heart care, we have created designated Provider Care Teams.  These Care Teams include your primary Cardiologist (physician) and Advanced Practice Providers (APPs -  Physician Assistants and Nurse Practitioners) who all work together to provide you with the care you need, when you need it.  We recommend signing up for the patient portal called "MyChart".  Sign up information is provided on this After Visit Summary.  MyChart is used to connect with patients for Virtual Visits (Telemedicine).  Patients are able to view lab/test results, encounter notes, upcoming appointments, etc.  Non-urgent messages can be sent to your provider as well.   To learn more about what you can do with MyChart, go to [email or web address].    Your next appointment:   1 year(s)  Provider:   Armanda Magic, MD

## 2022-11-22 NOTE — Progress Notes (Signed)
Cardiology Office Note:    Date:  11/22/2022   ID:  George Cain, DOB 1930-04-18, MRN [id number]  PCP:  Deatra James, MD  Cardiologist:  Armanda Magic, MD    Referring MD: Deatra James, MD   Chief Complaint  Patient presents with   Coronary Artery Disease   Atrial Fibrillation   Hypertension   Hyperlipidemia    History of Present Illness:    George Cain is a 89 y.o. male with a hx of PAF s/p RFA 07/2012, nonobstructive ASCAD with 20% LAD by cath 2011, HTN and dyslipidemia.  He has declined anticoagulation in the past.  He was seen by Dr. Graciela Husbands due to bradycardia on Sotolol and the sotolol was stopped.  He continued to refuse anticoagulation.  He started to have palpitations again and he restarted Sotolol at 40mg  weekly and eventually went on Eliquis 5mg  BID.  He is followed in A-fib clinic.  He is here today for followup and is doing well.  Since I saw I'm last he was dx with COVID and had to be hospitalized for the day.  He denies any chest pain or pressure, SOB, DOE, PND, orthopnea, LE edema, dizziness, palpitations or syncope. He is compliant with his meds and is tolerating meds with no SE.    Past Medical History:  Diagnosis Date   Allergic rhinitis    Benign essential HTN    BPH (benign prostatic hypertrophy)    Coronary artery disease 2011   nonobstructive ASCAD with 20% LAD s   Hyperlipidemia    Patient refused statins   PAF (paroxysmal atrial fibrillation) (HCC)    s/p RFA 2014.    Past Surgical History:  Procedure Laterality Date   CARDIAC CATHETERIZATION  2011   Nonobstructive ASCAD w 20% LAD   COLONOSCOPY  06/15/2010   RADIOFREQUENCY ABLATION  08/23/12   TONSILLECTOMY  1940   as child    Current Medications: Current Meds  Medication Sig   acetaminophen (TYLENOL) 500 MG tablet Take 500 mg by mouth 2 (two) times daily.   amLODipine (NORVASC) 2.5 MG tablet 1 tablet   apixaban (ELIQUIS) 5 MG TABS tablet TAKE ONE-HALF TABLET BY MOUTH TWICE A DAY (CAUTION:  BLOOD THINNER)*APPROVED*   B Complex-C (SUPER B COMPLEX/VITAMIN C) TABS Take 1 tablet by mouth daily.   cetirizine (ZYRTEC) 10 MG tablet Take 10 mg by mouth daily.    cyclobenzaprine (FLEXERIL) 10 MG tablet Take 10 mg by mouth as needed for muscle spasms.   docusate calcium (SURFAK) 240 MG capsule Take 1 capsule by mouth daily.   doxycycline (VIBRAMYCIN) 50 MG capsule Take 50 mg by mouth as needed (for Rosacea).   Ferrous Sulfate (IRON) 325 (65 Fe) MG TABS 1 tablet   finasteride (PROSCAR) 5 MG tablet Take 5 mg by mouth daily.   fluticasone (FLONASE) 50 MCG/ACT nasal spray Place 2 sprays into both nostrils daily.   ipratropium (ATROVENT HFA) 17 MCG/ACT inhaler Inhale 2 puffs into the lungs as directed.   ipratropium (ATROVENT) 0.03 % nasal spray SPRAY 2 SPRAYS INTO EACH NOSTRIL THREE TIMES A DAY AS NEEDED   magnesium oxide (MAG-OX) 400 MG tablet Take 400 mg by mouth daily.   methocarbamol (ROBAXIN) 500 MG tablet 1 tablet   metroNIDAZOLE (METROCREAM) 0.75 % cream APPLY SMALL AMOUNT TO AFFECTED AREA DAILY   Multiple Vitamin (MULTI-VITAMINS) TABS Take by mouth daily.    Multiple Vitamins-Minerals (ICAPS AREDS 2 PO) Take by mouth daily.   omeprazole (PRILOSEC) 20 MG capsule Take 1  capsule by mouth daily.   polyethylene glycol (MIRALAX / GLYCOLAX) packet Take 17 g by mouth daily as needed for moderate constipation.   potassium gluconate 595 (99 K) MG TABS tablet 1 tablet   rOPINIRole (REQUIP) 0.25 MG tablet Take 0.25 mg by mouth as needed.   sotalol (BETAPACE) 80 MG tablet Take 0.5 tablets (40 mg total) by mouth 2 (two) times a week.     Allergies:   Keflex [cephalexin]   Social History   Socioeconomic History   Marital status: Married    Spouse name: Not on file   Number of children: Not on file   Years of education: Not on file   Highest education level: Not on file  Occupational History   Not on file  Tobacco Use   Smoking status: Never   Smokeless tobacco: Never   Tobacco  comments:    Never smoke 07/22/21  Vaping Use   Vaping status: Never Used  Substance and Sexual Activity   Alcohol use: Yes    Alcohol/week: 7.0 standard drinks of alcohol    Types: 7 Cans of beer per week    Comment: 1 beer daily 06/23/21   Drug use: No   Sexual activity: Not on file  Other Topics Concern   Not on file  Social History Narrative   Not on file   Social Determinants of Health   Financial Resource Strain: Not on file  Food Insecurity: Not on file  Transportation Needs: Not on file  Physical Activity: Not on file  Stress: Not on file  Social Connections: Not on file     Family History: The patient's family history includes CAD in his father; Diabetes in his father; Heart attack in his father; Hypertension in his mother; Prostate cancer in his father.  ROS:   Please see the history of present illness.    ROS  All other systems reviewed and negative.   EKGs/Labs/Other Studies Reviewed:    The following studies were reviewed today: Outside labs on [facility]   Recent Labs: 10/20/2022: ALT 16; BUN 23; Creatinine, Ser 1.57; Hemoglobin 11.8; Magnesium 2.0; Platelets 189; Potassium 4.1; Sodium 133   Recent Lipid Panel    Component Value Date/Time   CHOL 176 09/25/2015 1011   TRIG 111 09/25/2015 1011   HDL 68 09/25/2015 1011   CHOLHDL 2.6 09/25/2015 1011   VLDL 22 09/25/2015 1011   LDLCALC 86 09/25/2015 1011    Physical Exam:    VS:  BP (!) 146/86 (BP Location: Right Arm, Patient Position: Sitting, Cuff Size: Normal)   Pulse 66   Ht 5' 8.5" (1.74 m)   Wt 171 lb 12.8 oz (77.9 kg)   SpO2 99%   BMI 25.74 kg/m     Wt Readings from Last 3 Encounters:  11/22/22 171 lb 12.8 oz (77.9 kg)  10/20/22 175 lb (79.4 kg)  06/21/22 172 lb (78 kg)    GEN: Well nourished, well developed in no acute distress HEENT: Normal NECK: No JVD; No carotid bruits LYMPHATICS: No lymphadenopathy CARDIAC:RRR, no murmurs, rubs, gallops RESPIRATORY:  Clear to auscultation without  rales, wheezing or rhonchi  ABDOMEN: Soft, non-tender, non-distended MUSCULOSKELETAL:  No edema; No deformity  SKIN: Warm and dry NEUROLOGIC:  Alert and oriented x 3 PSYCHIATRIC:  Normal affect  ASSESSMENT:    1. Coronary artery disease involving native coronary artery of native heart without angina pectoris   2. Paroxysmal atrial fibrillation (HCC)   3. Dyslipidemia   4. Benign essential HTN  PLAN:    In order of problems listed above:  1.  ASCAD -nonobstructive ASCAD with 20% LAD by cath 2011.   -He denies any anginal symptoms since I saw him last -no ASA due to DOAC -he has refused statin therapy -No beta-blocker due to bradycardia in the past  2.  PAF  -S/P remote RFA 2014 by EP -He denies any recent palpitations recently  He was in NSR early in the year and then was in afib with RVR when he had COVID last month.  Today I think he is back in NSR by exam and HR in the 60's -Continue prescription drug management with apixaban 5 mg twice daily and Betapace 40 mg twice daily with as needed refills -He denies any bleeding problems on DOAC -I have personally reviewed and interpreted outside labs performed by patient's PCP which showed SCr 1.52, K+ 4.7, Hbg 12 on 10/01/22  3.  HLD -LDL goal is < 70 but he has refused statin therapy -check FLP and ALT  4.  HTN -BP is borderline controlled on exam today but he brought in BP readings from home which range from 122-158/71-90 but mostly were in the 120-140's over 70's -Continue drug management with amlodipine 2.5 mg daily with as needed refills  Followup with me in 1 year  Medication Adjustments/Labs and Tests Ordered: Current medicines are reviewed at length with the patient today.  Concerns regarding medicines are outlined above.  No orders of the defined types were placed in this encounter.  No orders of the defined types were placed in this encounter.   Signed, Armanda Magic, MD  11/22/2022 2:11 PM    [facility]

## 2022-11-24 ENCOUNTER — Ambulatory Visit: Payer: Medicare Other | Admitting: Cardiology

## 2022-11-26 ENCOUNTER — Ambulatory Visit: Payer: Medicare Other | Admitting: Nurse Practitioner

## 2022-12-03 DIAGNOSIS — E785 Hyperlipidemia, unspecified: Secondary | ICD-10-CM | POA: Diagnosis not present

## 2022-12-10 ENCOUNTER — Telehealth: Payer: Self-pay

## 2022-12-10 DIAGNOSIS — E785 Hyperlipidemia, unspecified: Secondary | ICD-10-CM

## 2022-12-10 DIAGNOSIS — I2510 Atherosclerotic heart disease of native coronary artery without angina pectoris: Secondary | ICD-10-CM

## 2022-12-10 NOTE — Telephone Encounter (Signed)
Called to discuss lab results, no answer. Left detailed message per DPR explaining that LDL was not at goal and Dr. Mayford Knife recommends referral to lipid clinic. Referral placed, advised patient to call office with any questions.

## 2022-12-10 NOTE — Telephone Encounter (Signed)
-----   Message from Armanda Magic sent at 12/07/2022  9:15 PM EDT ----- Lipids still not at goal.  Please forward to lipid clinic for further recommendations.

## 2022-12-17 ENCOUNTER — Ambulatory Visit: Payer: Medicare Other | Attending: Interventional Cardiology | Admitting: Pharmacist

## 2022-12-17 DIAGNOSIS — E785 Hyperlipidemia, unspecified: Secondary | ICD-10-CM | POA: Diagnosis not present

## 2022-12-17 MED ORDER — ROSUVASTATIN CALCIUM 5 MG PO TABS: 5.0000 mg | ORAL_TABLET | ORAL | 3 refills | Status: DC

## 2022-12-17 MED ORDER — ROSUVASTATIN CALCIUM 5 MG PO TABS: 5.0000 mg | ORAL_TABLET | ORAL | 0 refills | Status: DC

## 2022-12-17 NOTE — Progress Notes (Unsigned)
Patient ID: George Cain                 DOB: [birth year]                    MRN: [id number]      HPI: George Cain is a 89 y.o. male patient referred to lipid clinic by Dr. Mayford Knife. PMH is significant for PAF s/p RFA 07/2012, nonobstructive ASCAD with 20% LAD by cath 2011, HTN and dyslipidemia. Patient has historically refused statin treatment. His LDL-C is 91 on no medications. Non-HDL 112.  Patient presents today to lipid clinic. He had a cath at age 21 that showed only 20% obstruction in the LAD. He exercises regularly. Originally from [place]. Was in the [place] for 4 years. Uses the [place].  Current Medications: none Intolerances: none Risk Factors: age LDL-C goal: <70  Exercise: 15 min on treadmill 3x week, weight machines 45 min, 45 min stretches   Labs: Lipid Panel  12/03/22 LDL-91, TG 110    Component Value Date/Time   CHOL 176 09/25/2015 1011   TRIG 111 09/25/2015 1011   HDL 68 09/25/2015 1011   CHOLHDL 2.6 09/25/2015 1011   VLDL 22 09/25/2015 1011   LDLCALC 86 09/25/2015 1011    Past Medical History:  Diagnosis Date   Allergic rhinitis    Benign essential HTN    BPH (benign prostatic hypertrophy)    Coronary artery disease 2011   nonobstructive ASCAD with 20% LAD s   Hyperlipidemia    Patient refused statins   PAF (paroxysmal atrial fibrillation) (HCC)    s/p RFA 2014.    Current Outpatient Medications on File Prior to Visit  Medication Sig Dispense Refill   acetaminophen (TYLENOL) 500 MG tablet Take 500 mg by mouth 2 (two) times daily.     amLODipine (NORVASC) 2.5 MG tablet 1 tablet     apixaban (ELIQUIS) 5 MG TABS tablet TAKE ONE-HALF TABLET BY MOUTH TWICE A DAY (CAUTION: BLOOD THINNER)*APPROVED*     B Complex-C (SUPER B COMPLEX/VITAMIN C) TABS Take 1 tablet by mouth daily.     cetirizine (ZYRTEC) 10 MG tablet Take 10 mg by mouth daily.      cyclobenzaprine (FLEXERIL) 10 MG tablet Take 10 mg by mouth as needed for muscle spasms.     docusate calcium (SURFAK)  240 MG capsule Take 1 capsule by mouth daily.     doxycycline (VIBRAMYCIN) 50 MG capsule Take 50 mg by mouth as needed (for Rosacea).     Ferrous Sulfate (IRON) 325 (65 Fe) MG TABS 1 tablet     finasteride (PROSCAR) 5 MG tablet Take 5 mg by mouth daily.     fluticasone (FLONASE) 50 MCG/ACT nasal spray Place 2 sprays into both nostrils daily.     ipratropium (ATROVENT HFA) 17 MCG/ACT inhaler Inhale 2 puffs into the lungs as directed.     ipratropium (ATROVENT) 0.03 % nasal spray SPRAY 2 SPRAYS INTO EACH NOSTRIL THREE TIMES A DAY AS NEEDED     magnesium oxide (MAG-OX) 400 MG tablet Take 400 mg by mouth daily.     methocarbamol (ROBAXIN) 500 MG tablet 1 tablet     metroNIDAZOLE (METROCREAM) 0.75 % cream APPLY SMALL AMOUNT TO AFFECTED AREA DAILY     Multiple Vitamin (MULTI-VITAMINS) TABS Take by mouth daily.      Multiple Vitamins-Minerals (ICAPS AREDS 2 PO) Take by mouth daily.     omeprazole (PRILOSEC) 20 MG capsule Take 1 capsule by mouth daily.  polyethylene glycol (MIRALAX / GLYCOLAX) packet Take 17 g by mouth daily as needed for moderate constipation.     potassium gluconate 595 (99 K) MG TABS tablet 1 tablet     rOPINIRole (REQUIP) 0.25 MG tablet Take 0.25 mg by mouth as needed.     sotalol (BETAPACE) 80 MG tablet Take 0.5 tablets (40 mg total) by mouth 2 (two) times a week. 10 tablet 3   No current facility-administered medications on file prior to visit.    Allergies  Allergen Reactions   Keflex [Cephalexin] Diarrhea, Nausea Only, Other (See Comments) and Nausea And Vomiting    Night sweats also Fever, chills diarrhea Night sweats also Fever, chills diarrhea Night sweats also    Assessment/Plan:  1. Hyperlipidemia -  Dyslipidemia Assessment: LDL-C is fairly well controlled on no medications Patient with very mild disease of his LAD We reviewed statin data. He does hope to live > 5 more years Reviewed side effects and medication can be stopped at any time if  occur Exercises regularly  Plan: Will start rosuvastatin 5mg  three times a week [facility] and also [facility] Lab orders given for labs at [facility] you,  Olene Floss, Pharm.D, BCACP, BCPS, CPP [facility] [address], [place], [place] [address]  Phone: [phone]; Fax: [phone]

## 2022-12-17 NOTE — Assessment & Plan Note (Signed)
Assessment: LDL-C is fairly well controlled on no medications Patient with very mild disease of his LAD We reviewed statin data. He does hope to live > 5 more years Reviewed side effects and medication can be stopped at any time if occur Exercises regularly  Plan: Will start rosuvastatin 5mg  three times a week Rx sent to [facility] and also [facility] Lab orders given for labs at [facility]

## 2022-12-17 NOTE — Patient Instructions (Signed)
Please start rosuvastatin 5mg  three times a week Please call me at [phone] with any questions or problems

## 2022-12-22 ENCOUNTER — Ambulatory Visit (HOSPITAL_COMMUNITY): Admission: RE | Admit: 2022-12-22 | Payer: Medicare Other | Source: Ambulatory Visit | Admitting: Physician Assistant

## 2022-12-22 ENCOUNTER — Encounter (HOSPITAL_COMMUNITY): Payer: Self-pay | Admitting: Physician Assistant

## 2022-12-22 VITALS — BP 126/80 | HR 66 | Ht 68.5 in | Wt 170.2 lb

## 2022-12-22 DIAGNOSIS — Z5181 Encounter for therapeutic drug level monitoring: Secondary | ICD-10-CM | POA: Diagnosis not present

## 2022-12-22 DIAGNOSIS — I2510 Atherosclerotic heart disease of native coronary artery without angina pectoris: Secondary | ICD-10-CM | POA: Insufficient documentation

## 2022-12-22 DIAGNOSIS — I4819 Other persistent atrial fibrillation: Secondary | ICD-10-CM | POA: Diagnosis not present

## 2022-12-22 DIAGNOSIS — I480 Paroxysmal atrial fibrillation: Secondary | ICD-10-CM

## 2022-12-22 DIAGNOSIS — I10 Essential (primary) hypertension: Secondary | ICD-10-CM | POA: Insufficient documentation

## 2022-12-22 DIAGNOSIS — D6869 Other thrombophilia: Secondary | ICD-10-CM | POA: Diagnosis not present

## 2022-12-22 DIAGNOSIS — Z79899 Other long term (current) drug therapy: Secondary | ICD-10-CM | POA: Diagnosis not present

## 2022-12-22 DIAGNOSIS — Z7901 Long term (current) use of anticoagulants: Secondary | ICD-10-CM | POA: Insufficient documentation

## 2022-12-22 LAB — BASIC METABOLIC PANEL
Anion gap: 12 (ref 5–15)
BUN: 19 mg/dL (ref 8–23)
CO2: 20 mmol/L — ABNORMAL LOW (ref 22–32)
Calcium: 9.2 mg/dL (ref 8.9–10.3)
Chloride: 105 mmol/L (ref 98–111)
Creatinine, Ser: 1.48 mg/dL — ABNORMAL HIGH (ref 0.61–1.24)
GFR, Estimated: 44 mL/min — ABNORMAL LOW (ref >60)
Glucose, Bld: 102 mg/dL — ABNORMAL HIGH (ref 70–99)
Potassium: 4.3 mmol/L (ref 3.5–5.1)
Sodium: 137 mmol/L (ref 135–145)

## 2022-12-22 LAB — MAGNESIUM: Magnesium: 2.1 mg/dL (ref 1.7–2.4)

## 2022-12-22 NOTE — Progress Notes (Signed)
Primary Care Physician: Deatra James, MD Primary Cardiologist: Dr Mayford Knife Primary Electrophysiologist: Dr Graciela Husbands (remotely) Referring Physician: Dr Levada Schilling is a 89 y.o. male with a history of CAD, HTN, HLD, atrial fibrillation who presents for follow up in the [facility]. The patient was initially diagnosed with atrial fibrillation remotely and is s/p ablation in 2014. He had been maintained on once weekly sotalol with good success. He did have bradycardia on higher doses of sotalol. Patient is on Eliquis for a CHADS2VASC score of 4. Patient saw Dr Mayford Knife on 05/19/22 and stated he was having more frequent elevated heart rates, especially during exercise. He was in rate controlled afib at that visit. Patient started taking a magnesium supplement and taking his sotalol twice weekly.  On follow up today, patient reports that he has done well from a cardiac standpoint. He was diagnosed with COVID in June and was in afib at that time but patient denies any afib symptoms since then. No bleeding issues on anticoagulation.   Today, he denies symptoms of palpitations, chest pain, shortness of breath, orthopnea, PND, lower extremity edema, dizziness, presyncope, syncope, snoring, daytime somnolence, bleeding, or neurologic sequela. The patient is tolerating medications without difficulties and is otherwise without complaint today.    Atrial Fibrillation Risk Factors:  he does not have symptoms or diagnosis of sleep apnea. he does not have a history of rheumatic fever. he does have a history of alcohol use. The patient does not have a history of early familial atrial fibrillation or other arrhythmias.   Atrial Fibrillation Management history:  Previous antiarrhythmic drugs: sotalol Previous cardioversions: none Previous ablations: 2014 Anticoagulation history: Eliquis   Past Medical History:  Diagnosis Date   Allergic rhinitis    Benign essential HTN     BPH (benign prostatic hypertrophy)    Coronary artery disease 2011   nonobstructive ASCAD with 20% LAD s   Hyperlipidemia    Patient refused statins   PAF (paroxysmal atrial fibrillation) (HCC)    s/p RFA 2014.    Current Outpatient Medications  Medication Sig Dispense Refill   acetaminophen (TYLENOL) 500 MG tablet Take 500 mg by mouth 2 (two) times daily.     amLODipine (NORVASC) 2.5 MG tablet 1 tablet     apixaban (ELIQUIS) 5 MG TABS tablet TAKE ONE-HALF TABLET BY MOUTH TWICE A DAY (CAUTION: BLOOD THINNER)*APPROVED*     B Complex-C (SUPER B COMPLEX/VITAMIN C) TABS Take 1 tablet by mouth daily.     cetirizine (ZYRTEC) 10 MG tablet Take 10 mg by mouth daily.      cyclobenzaprine (FLEXERIL) 10 MG tablet Take 10 mg by mouth as needed for muscle spasms.     docusate calcium (SURFAK) 240 MG capsule Take 1 capsule by mouth daily.     doxycycline (VIBRAMYCIN) 50 MG capsule Take 50 mg by mouth as needed (for Rosacea).     Ferrous Sulfate (IRON) 325 (65 Fe) MG TABS 1 tablet     finasteride (PROSCAR) 5 MG tablet Take 5 mg by mouth daily.     fluticasone (FLONASE) 50 MCG/ACT nasal spray Place 2 sprays into both nostrils daily.     ipratropium (ATROVENT HFA) 17 MCG/ACT inhaler Inhale 2 puffs into the lungs as directed.     ipratropium (ATROVENT) 0.03 % nasal spray SPRAY 2 SPRAYS INTO EACH NOSTRIL THREE TIMES A DAY AS NEEDED     magnesium oxide (MAG-OX) 400 MG tablet Take 400 mg by mouth daily.  methocarbamol (ROBAXIN) 500 MG tablet 1 tablet     metroNIDAZOLE (METROCREAM) 0.75 % cream APPLY SMALL AMOUNT TO AFFECTED AREA DAILY     Multiple Vitamin (MULTI-VITAMINS) TABS Take by mouth daily.      Multiple Vitamins-Minerals (ICAPS AREDS 2 PO) Take by mouth daily.     omeprazole (PRILOSEC) 20 MG capsule Take 1 capsule by mouth daily.     polyethylene glycol (MIRALAX / GLYCOLAX) packet Take 17 g by mouth daily as needed for moderate constipation.     potassium gluconate 595 (99 K) MG TABS tablet  1 tablet     rOPINIRole (REQUIP) 0.25 MG tablet Take 0.25 mg by mouth as needed.     rosuvastatin (CRESTOR) 5 MG tablet Take 1 tablet (5 mg total) by mouth 3 (three) times a week. 36 tablet 3   sotalol (BETAPACE) 80 MG tablet Take 0.5 tablets (40 mg total) by mouth 2 (two) times a week. 10 tablet 3   No current facility-administered medications for this encounter.    ROS- All systems are reviewed and negative except as per the HPI above.  Physical Exam: Vitals:   12/22/22 1047  BP: 126/80  Pulse: 66  Weight: 77.2 kg  Height: 5' 8.5" (1.74 m)    GEN: Well nourished, well developed in no acute distress NECK: No JVD; No carotid bruits CARDIAC: Regular rate and rhythm, no murmurs, rubs, gallops RESPIRATORY:  Clear to auscultation without rales, wheezing or rhonchi  ABDOMEN: Soft, non-tender, non-distended EXTREMITIES:  No edema; No deformity    Wt Readings from Last 3 Encounters:  12/22/22 77.2 kg  11/22/22 77.9 kg  10/20/22 79.4 kg    EKG today demonstrates  SR Vent. rate 66 BPM PR interval 186 ms QRS duration 80 ms QT/QTcB 396/415 ms   Epic records are reviewed at length today  CHA2DS2-VASc Score = 4  The patient's score is based upon: CHF History: 0 HTN History: 1 Diabetes History: 0 Stroke History: 0 Vascular Disease History: 1 Age Score: 2 Gender Score: 0       ASSESSMENT AND PLAN: Persistent atrial fibrillation  The patient's CHA2DS2-VASc score is 4, indicating a 4.8% annual risk of stroke.   Patient appears to be maintaining SR.  Could consider changing AAD to dofetilide in the future if needed. Amiodarone would likely not be a good option for him given his resting bradycardia. Continue sotalol 40 mg twice weekly.  Check bmet/mag today Continue Eliquis 2.5 mg BID  Secondary Hypercoagulable State (ICD10:  D68.69) The patient is at significant risk for stroke/thromboembolism based upon his CHA2DS2-VASc Score of 4.  Continue Apixaban (Eliquis).    HTN Stable on current regimen  CAD On low dose statin No anginal symptoms   Follow up in the AF clinic in 6 months.    Jorja Loa PA-C Afib Clinic [facility] [address] [place], [place] [address] [phone] 12/22/2022 12:09 PM

## 2023-01-03 ENCOUNTER — Other Ambulatory Visit: Payer: Self-pay | Admitting: Cardiology

## 2023-01-04 ENCOUNTER — Other Ambulatory Visit: Payer: Self-pay | Admitting: Pharmacist

## 2023-01-06 ENCOUNTER — Other Ambulatory Visit: Payer: Self-pay | Admitting: Pharmacist

## 2023-01-06 MED ORDER — ROSUVASTATIN CALCIUM 5 MG PO TABS: 5.0000 mg | ORAL_TABLET | ORAL | 3 refills | Status: DC

## 2023-01-19 DIAGNOSIS — M25552 Pain in left hip: Secondary | ICD-10-CM | POA: Diagnosis not present

## 2023-01-19 DIAGNOSIS — I2510 Atherosclerotic heart disease of native coronary artery without angina pectoris: Secondary | ICD-10-CM | POA: Diagnosis not present

## 2023-01-19 DIAGNOSIS — D6869 Other thrombophilia: Secondary | ICD-10-CM | POA: Diagnosis not present

## 2023-01-19 DIAGNOSIS — E785 Hyperlipidemia, unspecified: Secondary | ICD-10-CM | POA: Diagnosis not present

## 2023-01-19 DIAGNOSIS — I480 Paroxysmal atrial fibrillation: Secondary | ICD-10-CM | POA: Diagnosis not present

## 2023-01-26 DIAGNOSIS — M5489 Other dorsalgia: Secondary | ICD-10-CM | POA: Diagnosis not present

## 2023-01-31 DIAGNOSIS — M5489 Other dorsalgia: Secondary | ICD-10-CM | POA: Diagnosis not present

## 2023-02-01 DIAGNOSIS — Z23 Encounter for immunization: Secondary | ICD-10-CM | POA: Diagnosis not present

## 2023-02-03 DIAGNOSIS — M5489 Other dorsalgia: Secondary | ICD-10-CM | POA: Diagnosis not present

## 2023-02-08 DIAGNOSIS — M5489 Other dorsalgia: Secondary | ICD-10-CM | POA: Diagnosis not present

## 2023-02-10 DIAGNOSIS — M5489 Other dorsalgia: Secondary | ICD-10-CM | POA: Diagnosis not present

## 2023-02-17 DIAGNOSIS — M5489 Other dorsalgia: Secondary | ICD-10-CM | POA: Diagnosis not present

## 2023-02-22 DIAGNOSIS — M5489 Other dorsalgia: Secondary | ICD-10-CM | POA: Diagnosis not present

## 2023-02-23 DIAGNOSIS — E785 Hyperlipidemia, unspecified: Secondary | ICD-10-CM | POA: Diagnosis not present

## 2023-02-25 DIAGNOSIS — M5489 Other dorsalgia: Secondary | ICD-10-CM | POA: Diagnosis not present

## 2023-02-28 ENCOUNTER — Telehealth: Payer: Self-pay | Admitting: Pharmacist

## 2023-02-28 NOTE — Telephone Encounter (Signed)
Received labs from PCP office. LDL-C 53, TG 69 TC 125 HDL 67

## 2023-03-01 DIAGNOSIS — M5489 Other dorsalgia: Secondary | ICD-10-CM | POA: Diagnosis not present

## 2023-03-03 DIAGNOSIS — M5489 Other dorsalgia: Secondary | ICD-10-CM | POA: Diagnosis not present

## 2023-03-03 NOTE — Telephone Encounter (Signed)
Spoke with patient about his labs. He is doing well on rosuvastatin 5mg  three times a week. LDL-C at goal. Will continue.

## 2023-03-08 DIAGNOSIS — M5489 Other dorsalgia: Secondary | ICD-10-CM | POA: Diagnosis not present

## 2023-03-11 DIAGNOSIS — M5489 Other dorsalgia: Secondary | ICD-10-CM | POA: Diagnosis not present

## 2023-03-15 DIAGNOSIS — M9901 Segmental and somatic dysfunction of cervical region: Secondary | ICD-10-CM | POA: Diagnosis not present

## 2023-03-15 DIAGNOSIS — M9903 Segmental and somatic dysfunction of lumbar region: Secondary | ICD-10-CM | POA: Diagnosis not present

## 2023-03-15 DIAGNOSIS — M9904 Segmental and somatic dysfunction of sacral region: Secondary | ICD-10-CM | POA: Diagnosis not present

## 2023-03-15 DIAGNOSIS — M542 Cervicalgia: Secondary | ICD-10-CM | POA: Diagnosis not present

## 2023-04-13 DIAGNOSIS — M9903 Segmental and somatic dysfunction of lumbar region: Secondary | ICD-10-CM | POA: Diagnosis not present

## 2023-04-13 DIAGNOSIS — M9901 Segmental and somatic dysfunction of cervical region: Secondary | ICD-10-CM | POA: Diagnosis not present

## 2023-04-13 DIAGNOSIS — M542 Cervicalgia: Secondary | ICD-10-CM | POA: Diagnosis not present

## 2023-04-13 DIAGNOSIS — M9904 Segmental and somatic dysfunction of sacral region: Secondary | ICD-10-CM | POA: Diagnosis not present

## 2023-05-18 DIAGNOSIS — M9901 Segmental and somatic dysfunction of cervical region: Secondary | ICD-10-CM | POA: Diagnosis not present

## 2023-05-18 DIAGNOSIS — M9903 Segmental and somatic dysfunction of lumbar region: Secondary | ICD-10-CM | POA: Diagnosis not present

## 2023-05-18 DIAGNOSIS — M542 Cervicalgia: Secondary | ICD-10-CM | POA: Diagnosis not present

## 2023-05-18 DIAGNOSIS — M9904 Segmental and somatic dysfunction of sacral region: Secondary | ICD-10-CM | POA: Diagnosis not present

## 2023-05-29 ENCOUNTER — Other Ambulatory Visit: Payer: Self-pay | Admitting: Physician Assistant

## 2023-05-31 DIAGNOSIS — M25571 Pain in right ankle and joints of right foot: Secondary | ICD-10-CM | POA: Diagnosis not present

## 2023-05-31 DIAGNOSIS — H9201 Otalgia, right ear: Secondary | ICD-10-CM | POA: Diagnosis not present

## 2023-05-31 DIAGNOSIS — N1831 Chronic kidney disease, stage 3a: Secondary | ICD-10-CM | POA: Diagnosis not present

## 2023-05-31 DIAGNOSIS — R52 Pain, unspecified: Secondary | ICD-10-CM | POA: Diagnosis not present

## 2023-05-31 DIAGNOSIS — I480 Paroxysmal atrial fibrillation: Secondary | ICD-10-CM | POA: Diagnosis not present

## 2023-05-31 DIAGNOSIS — G2581 Restless legs syndrome: Secondary | ICD-10-CM | POA: Diagnosis not present

## 2023-05-31 DIAGNOSIS — I129 Hypertensive chronic kidney disease with stage 1 through stage 4 chronic kidney disease, or unspecified chronic kidney disease: Secondary | ICD-10-CM | POA: Diagnosis not present

## 2023-06-21 DIAGNOSIS — M9901 Segmental and somatic dysfunction of cervical region: Secondary | ICD-10-CM | POA: Diagnosis not present

## 2023-06-21 DIAGNOSIS — M542 Cervicalgia: Secondary | ICD-10-CM | POA: Diagnosis not present

## 2023-06-21 DIAGNOSIS — M9903 Segmental and somatic dysfunction of lumbar region: Secondary | ICD-10-CM | POA: Diagnosis not present

## 2023-06-21 DIAGNOSIS — M9904 Segmental and somatic dysfunction of sacral region: Secondary | ICD-10-CM | POA: Diagnosis not present

## 2023-06-24 ENCOUNTER — Ambulatory Visit (HOSPITAL_COMMUNITY)
Admission: RE | Admit: 2023-06-24 | Discharge: 2023-06-24 | Disposition: A | Discharge status: Home / Self Care | Payer: Medicare Other | Source: Ambulatory Visit | Attending: Physician Assistant | Admitting: Physician Assistant

## 2023-06-24 VITALS — BP 160/80 | HR 59 | Ht 68.5 in | Wt 173.0 lb

## 2023-06-24 DIAGNOSIS — Z5181 Encounter for therapeutic drug level monitoring: Secondary | ICD-10-CM

## 2023-06-24 DIAGNOSIS — R001 Bradycardia, unspecified: Secondary | ICD-10-CM | POA: Diagnosis not present

## 2023-06-24 DIAGNOSIS — I2510 Atherosclerotic heart disease of native coronary artery without angina pectoris: Secondary | ICD-10-CM | POA: Diagnosis not present

## 2023-06-24 DIAGNOSIS — E785 Hyperlipidemia, unspecified: Secondary | ICD-10-CM | POA: Insufficient documentation

## 2023-06-24 DIAGNOSIS — D6869 Other thrombophilia: Secondary | ICD-10-CM

## 2023-06-24 DIAGNOSIS — I10 Essential (primary) hypertension: Secondary | ICD-10-CM | POA: Diagnosis not present

## 2023-06-24 DIAGNOSIS — I440 Atrioventricular block, first degree: Secondary | ICD-10-CM | POA: Insufficient documentation

## 2023-06-24 DIAGNOSIS — I4819 Other persistent atrial fibrillation: Secondary | ICD-10-CM

## 2023-06-24 DIAGNOSIS — Z7901 Long term (current) use of anticoagulants: Secondary | ICD-10-CM | POA: Diagnosis not present

## 2023-06-24 DIAGNOSIS — Z79899 Other long term (current) drug therapy: Secondary | ICD-10-CM | POA: Diagnosis not present

## 2023-06-24 LAB — MAGNESIUM: Magnesium: 2.3 mg/dL (ref 1.7–2.4)

## 2023-06-24 LAB — BASIC METABOLIC PANEL
Anion gap: 7 (ref 5–15)
BUN: 21 mg/dL (ref 8–23)
CO2: 26 mmol/L (ref 22–32)
Calcium: 9.2 mg/dL (ref 8.9–10.3)
Chloride: 105 mmol/L (ref 98–111)
Creatinine, Ser: 1.48 mg/dL — ABNORMAL HIGH (ref 0.61–1.24)
GFR, Estimated: 44 mL/min — ABNORMAL LOW (ref >60)
Glucose, Bld: 94 mg/dL (ref 70–99)
Potassium: 4.9 mmol/L (ref 3.5–5.1)
Sodium: 138 mmol/L (ref 135–145)

## 2023-06-24 NOTE — Progress Notes (Signed)
 Primary Care Physician: Deatra James, MD Primary Cardiologist: Dr Mayford Knife Primary Electrophysiologist: Dr Graciela Husbands (remotely) Referring Physician: Dr Levada Schilling is a 89 y.o. male with a history of CAD, HTN, HLD, atrial fibrillation who presents for follow up in the [facility]. The patient was initially diagnosed with atrial fibrillation remotely and is s/p ablation in 2014. He had been maintained on once weekly sotalol with good success. He did have bradycardia on higher doses of sotalol. Patient is on Eliquis for a CHADS2VASC score of 4. Patient saw Dr Mayford Knife on 05/19/22 and stated he was having more frequent elevated heart rates, especially during exercise. He was in rate controlled afib at that visit. Patient started taking a magnesium supplement and taking his sotalol twice weekly.  Patient returns for follow up for atrial fibrillation and sotalol monitoring. He reports that he has done well with no interim symptoms of afib. No bleeding issues on anticoagulation.   Today, he denies symptoms of palpitations, chest pain, shortness of breath, orthopnea, PND, lower extremity edema, dizziness, presyncope, syncope, snoring, daytime somnolence, bleeding, or neurologic sequela. The patient is tolerating medications without difficulties and is otherwise without complaint today.    Atrial Fibrillation Risk Factors:  he does not have symptoms or diagnosis of sleep apnea. he does not have a history of rheumatic fever. he does have a history of alcohol use. The patient does not have a history of early familial atrial fibrillation or other arrhythmias.   Atrial Fibrillation Management history:  Previous antiarrhythmic drugs: sotalol Previous cardioversions: none Previous ablations: 2014 Anticoagulation history: Eliquis   Past Medical History:  Diagnosis Date   Allergic rhinitis    Benign essential HTN    BPH (benign prostatic hypertrophy)    Coronary  artery disease 2011   nonobstructive ASCAD with 20% LAD s   Hyperlipidemia    Patient refused statins   PAF (paroxysmal atrial fibrillation) (HCC)    s/p RFA 2014.    Current Outpatient Medications  Medication Sig Dispense Refill   acetaminophen (TYLENOL) 500 MG tablet Take 1,000 mg by mouth at bedtime.     amLODipine (NORVASC) 2.5 MG tablet Take 2.5 mg by mouth daily.     apixaban (ELIQUIS) 5 MG TABS tablet TAKE ONE-HALF TABLET BY MOUTH TWICE A DAY (CAUTION: BLOOD THINNER)*APPROVED*     B Complex-C (SUPER B COMPLEX/VITAMIN C) TABS Take 1 tablet by mouth daily.     cetirizine (ZYRTEC) 10 MG tablet Take 10 mg by mouth daily.      cyclobenzaprine (FLEXERIL) 10 MG tablet Take 10 mg by mouth as needed for muscle spasms.     docusate sodium (COLACE) 100 MG capsule Take 100 mg by mouth every morning.     doxycycline (VIBRAMYCIN) 50 MG capsule Take 50 mg by mouth as needed (for Rosacea).     Ferrous Sulfate (IRON) 325 (65 Fe) MG TABS Take 1 tablet by mouth every morning.     finasteride (PROSCAR) 5 MG tablet Take 5 mg by mouth daily.     fluticasone (FLONASE) 50 MCG/ACT nasal spray Place 2 sprays into both nostrils daily.     ipratropium (ATROVENT) 0.03 % nasal spray SPRAY 2 SPRAYS INTO EACH NOSTRIL THREE TIMES A DAY AS NEEDED     magnesium oxide (MAG-OX) 400 MG tablet Take 400 mg by mouth daily.     methocarbamol (ROBAXIN) 500 MG tablet Take 500 mg by mouth as needed.     metroNIDAZOLE (METROCREAM) 0.75 %  cream APPLY SMALL AMOUNT TO AFFECTED AREA DAILY     Multiple Vitamin (MULTI-VITAMINS) TABS Take by mouth daily.      Multiple Vitamins-Minerals (ICAPS AREDS 2 PO) Take 1 tablet by mouth 2 (two) times daily.     omeprazole (PRILOSEC) 20 MG capsule Take 1 capsule by mouth daily.     polyethylene glycol (MIRALAX / GLYCOLAX) packet Take 17 g by mouth daily as needed for moderate constipation.     potassium gluconate 595 (99 K) MG TABS tablet Take 1 tablet by mouth every morning.     rOPINIRole  (REQUIP) 0.25 MG tablet Take 0.25 mg by mouth as needed.     rosuvastatin (CRESTOR) 10 MG tablet Take 10 mg by mouth 3 (three) times a week.     sotalol (BETAPACE) 80 MG tablet TAKE HALF TABLET BY MOUTH TWICE WEEKLY 12 tablet 1   No current facility-administered medications for this encounter.    ROS- All systems are reviewed and negative except as per the HPI above.  Physical Exam: Vitals:   06/24/23 1104  BP: (!) 160/80  Pulse: (!) 59  Weight: 78.5 kg  Height: 5' 8.5" (1.74 m)    GEN: Well nourished, well developed in no acute distress CARDIAC: Regular rate and rhythm, no murmurs, rubs, gallops RESPIRATORY:  Clear to auscultation without rales, wheezing or rhonchi  ABDOMEN: Soft, non-tender, non-distended EXTREMITIES:  No edema; No deformity    Wt Readings from Last 3 Encounters:  06/24/23 78.5 kg  12/22/22 77.2 kg  11/22/22 77.9 kg    EKG today demonstrates  SB, 1st degree AV block Vent. rate 59 BPM PR interval 208 ms QRS duration 88 ms QT/QTcB 420/415 ms   Epic records are reviewed at length today  CHA2DS2-VASc Score = 4  The patient's score is based upon: CHF History: 0 HTN History: 1 Diabetes History: 0 Stroke History: 0 Vascular Disease History: 1 Age Score: 2 Gender Score: 0       ASSESSMENT AND PLAN: Persistent Atrial Fibrillation (ICD10:  I48.19) The patient's CHA2DS2-VASc score is 4, indicating a 4.8% annual risk of stroke.   Patient appears to be maintaining SR Continue sotalol 40 mg twice weekly Continue Eliquis 2.5 mg BID  Secondary Hypercoagulable State (ICD10:  D68.69) The patient is at significant risk for stroke/thromboembolism based upon his CHA2DS2-VASc Score of 4.  Continue Apixaban (Eliquis). No bleeding issues.   High Risk Medication Monitoring (ICD 10: Z79.899) QT interval on ECG acceptable for sotalol monitoring. Check bmet/mag today.      HTN Elevated today, well controlled at last visit. Patient will monitor with home BP  machine. If persistently elevated, could consider increasing amlodipine.   CAD No anginal symptoms Followed by Dr Mayford Knife   Follow up in the AF clinic in 6 months.    Jorja Loa PA-C [facility] [address] [place], [place] [address] [phone] 06/24/2023 11:31 AM

## 2023-07-25 DIAGNOSIS — M9901 Segmental and somatic dysfunction of cervical region: Secondary | ICD-10-CM | POA: Diagnosis not present

## 2023-07-25 DIAGNOSIS — M9904 Segmental and somatic dysfunction of sacral region: Secondary | ICD-10-CM | POA: Diagnosis not present

## 2023-07-25 DIAGNOSIS — M542 Cervicalgia: Secondary | ICD-10-CM | POA: Diagnosis not present

## 2023-07-25 DIAGNOSIS — M9903 Segmental and somatic dysfunction of lumbar region: Secondary | ICD-10-CM | POA: Diagnosis not present

## 2023-09-27 DIAGNOSIS — M9903 Segmental and somatic dysfunction of lumbar region: Secondary | ICD-10-CM | POA: Diagnosis not present

## 2023-09-27 DIAGNOSIS — M9904 Segmental and somatic dysfunction of sacral region: Secondary | ICD-10-CM | POA: Diagnosis not present

## 2023-09-27 DIAGNOSIS — M9901 Segmental and somatic dysfunction of cervical region: Secondary | ICD-10-CM | POA: Diagnosis not present

## 2023-09-27 DIAGNOSIS — M542 Cervicalgia: Secondary | ICD-10-CM | POA: Diagnosis not present

## 2023-10-05 DIAGNOSIS — X32XXXD Exposure to sunlight, subsequent encounter: Secondary | ICD-10-CM | POA: Diagnosis not present

## 2023-10-05 DIAGNOSIS — L570 Actinic keratosis: Secondary | ICD-10-CM | POA: Diagnosis not present

## 2023-10-05 DIAGNOSIS — Z1283 Encounter for screening for malignant neoplasm of skin: Secondary | ICD-10-CM | POA: Diagnosis not present

## 2023-10-05 DIAGNOSIS — D225 Melanocytic nevi of trunk: Secondary | ICD-10-CM | POA: Diagnosis not present

## 2023-11-01 DIAGNOSIS — L719 Rosacea, unspecified: Secondary | ICD-10-CM | POA: Diagnosis not present

## 2023-11-01 DIAGNOSIS — J309 Allergic rhinitis, unspecified: Secondary | ICD-10-CM | POA: Diagnosis not present

## 2023-11-01 DIAGNOSIS — I129 Hypertensive chronic kidney disease with stage 1 through stage 4 chronic kidney disease, or unspecified chronic kidney disease: Secondary | ICD-10-CM | POA: Diagnosis not present

## 2023-11-01 DIAGNOSIS — Z1331 Encounter for screening for depression: Secondary | ICD-10-CM | POA: Diagnosis not present

## 2023-11-01 DIAGNOSIS — N400 Enlarged prostate without lower urinary tract symptoms: Secondary | ICD-10-CM | POA: Diagnosis not present

## 2023-11-01 DIAGNOSIS — E785 Hyperlipidemia, unspecified: Secondary | ICD-10-CM | POA: Diagnosis not present

## 2023-11-01 DIAGNOSIS — I2510 Atherosclerotic heart disease of native coronary artery without angina pectoris: Secondary | ICD-10-CM | POA: Diagnosis not present

## 2023-11-01 DIAGNOSIS — G2581 Restless legs syndrome: Secondary | ICD-10-CM | POA: Diagnosis not present

## 2023-11-01 DIAGNOSIS — I480 Paroxysmal atrial fibrillation: Secondary | ICD-10-CM | POA: Diagnosis not present

## 2023-11-01 DIAGNOSIS — Z0000 Encounter for general adult medical examination without abnormal findings: Secondary | ICD-10-CM | POA: Diagnosis not present

## 2023-11-01 DIAGNOSIS — N1831 Chronic kidney disease, stage 3a: Secondary | ICD-10-CM | POA: Diagnosis not present

## 2023-11-01 DIAGNOSIS — M47812 Spondylosis without myelopathy or radiculopathy, cervical region: Secondary | ICD-10-CM | POA: Diagnosis not present

## 2023-11-03 DIAGNOSIS — Z961 Presence of intraocular lens: Secondary | ICD-10-CM | POA: Diagnosis not present

## 2023-11-03 DIAGNOSIS — H353111 Nonexudative age-related macular degeneration, right eye, early dry stage: Secondary | ICD-10-CM | POA: Diagnosis not present

## 2023-11-03 DIAGNOSIS — H318 Other specified disorders of choroid: Secondary | ICD-10-CM | POA: Diagnosis not present

## 2023-11-03 DIAGNOSIS — H35362 Drusen (degenerative) of macula, left eye: Secondary | ICD-10-CM | POA: Diagnosis not present

## 2023-11-03 DIAGNOSIS — H35373 Puckering of macula, bilateral: Secondary | ICD-10-CM | POA: Diagnosis not present

## 2023-11-03 DIAGNOSIS — H26491 Other secondary cataract, right eye: Secondary | ICD-10-CM | POA: Diagnosis not present

## 2023-11-13 ENCOUNTER — Other Ambulatory Visit: Payer: Self-pay | Admitting: Physician Assistant

## 2023-12-13 DIAGNOSIS — M9901 Segmental and somatic dysfunction of cervical region: Secondary | ICD-10-CM | POA: Diagnosis not present

## 2023-12-13 DIAGNOSIS — M9904 Segmental and somatic dysfunction of sacral region: Secondary | ICD-10-CM | POA: Diagnosis not present

## 2023-12-13 DIAGNOSIS — M9903 Segmental and somatic dysfunction of lumbar region: Secondary | ICD-10-CM | POA: Diagnosis not present

## 2023-12-13 DIAGNOSIS — M542 Cervicalgia: Secondary | ICD-10-CM | POA: Diagnosis not present

## 2023-12-21 ENCOUNTER — Ambulatory Visit (HOSPITAL_COMMUNITY): Admitting: Physician Assistant

## 2023-12-28 ENCOUNTER — Ambulatory Visit (HOSPITAL_COMMUNITY)
Admission: RE | Admit: 2023-12-28 | Discharge: 2023-12-28 | Disposition: A | Discharge status: Home / Self Care | Source: Ambulatory Visit | Attending: Physician Assistant | Admitting: Physician Assistant

## 2023-12-28 VITALS — BP 152/76 | HR 65 | Ht 68.5 in | Wt 172.2 lb

## 2023-12-28 DIAGNOSIS — D6869 Other thrombophilia: Secondary | ICD-10-CM | POA: Insufficient documentation

## 2023-12-28 DIAGNOSIS — Z79899 Other long term (current) drug therapy: Secondary | ICD-10-CM | POA: Insufficient documentation

## 2023-12-28 DIAGNOSIS — Z5181 Encounter for therapeutic drug level monitoring: Secondary | ICD-10-CM | POA: Insufficient documentation

## 2023-12-28 DIAGNOSIS — I4891 Unspecified atrial fibrillation: Secondary | ICD-10-CM | POA: Diagnosis not present

## 2023-12-28 DIAGNOSIS — I4819 Other persistent atrial fibrillation: Secondary | ICD-10-CM | POA: Diagnosis not present

## 2023-12-28 MED ORDER — SOTALOL HCL 80 MG PO TABS: 40.0000 mg | ORAL_TABLET | ORAL | 6 refills | Status: DC

## 2023-12-28 NOTE — Progress Notes (Signed)
 Primary Care Physician: Sun, Vyvyan, MD Primary Cardiologist: Dr George Cain Primary Electrophysiologist: Dr Fernande (remotely) Referring Physician: Dr George Cain George Cain is a 89 y.o. male with a history of CAD, HTN, HLD, atrial fibrillation who presents for follow up in the [facility]. The patient was initially diagnosed with atrial fibrillation remotely and is s/p ablation in 2014. He had been maintained on once weekly sotalol  with good success. He did have bradycardia on higher doses of sotalol . Patient is on Eliquis  for stroke prevention. Patient saw Dr George Cain on 05/19/22 and stated he was having more frequent elevated heart rates, especially during exercise. He was in rate controlled afib at that visit. Patient started taking a magnesium supplement and taking his sotalol  twice weekly.  Patient returns for follow up for atrial fibrillation and sotalol  monitoring. He is in SR today and feels well from a cardiac standpoint. No bleeding issues on anticoagulation. Of note, his wife passed away on 01/15/24.   Today, he  denies symptoms of palpitations, chest pain, shortness of breath, orthopnea, PND, lower extremity edema, dizziness, presyncope, syncope, snoring, daytime somnolence, bleeding, or neurologic sequela. The patient is tolerating medications without difficulties and is otherwise without complaint today.    Atrial Fibrillation Risk Factors:  he does not have symptoms or diagnosis of sleep apnea. he does not have a history of rheumatic fever. he does have a history of alcohol use. The patient does not have a history of early familial atrial fibrillation or other arrhythmias.   Atrial Fibrillation Management history:  Previous antiarrhythmic drugs: sotalol  Previous cardioversions: none Previous ablations: 2014 Anticoagulation history: Eliquis    Past Medical History:  Diagnosis Date   Allergic rhinitis    Benign essential HTN    BPH (benign prostatic  hypertrophy)    Coronary artery disease 2011   nonobstructive ASCAD with 20% LAD s   Hyperlipidemia    Patient refused statins   PAF (paroxysmal atrial fibrillation) (HCC)    s/p RFA 2014.    Current Outpatient Medications  Medication Sig Dispense Refill   acetaminophen (TYLENOL) 500 MG tablet Take 1,000 mg by mouth at bedtime.     amLODipine  (NORVASC ) 2.5 MG tablet Take 2.5 mg by mouth daily.     apixaban  (ELIQUIS ) 5 MG TABS tablet TAKE ONE-HALF TABLET BY MOUTH TWICE A DAY (CAUTION: BLOOD THINNER)*APPROVED*     B Complex-C (SUPER B COMPLEX/VITAMIN C) TABS Take 1 tablet by mouth daily.     carboxymethylcellulose (REFRESH PLUS) 0.5 % SOLN Place 1 drop into both eyes in the morning and at bedtime.     cetirizine (ZYRTEC) 10 MG tablet Take 10 mg by mouth daily.      cyclobenzaprine (FLEXERIL) 10 MG tablet Take 10 mg by mouth as needed for muscle spasms.     docusate sodium (COLACE) 100 MG capsule Take 100 mg by mouth every morning.     doxycycline  (VIBRAMYCIN ) 50 MG capsule Take 50 mg by mouth as needed (for Rosacea).     Ferrous Sulfate (IRON) 325 (65 Fe) MG TABS Take 1 tablet by mouth every morning.     finasteride (PROSCAR) 5 MG tablet Take 5 mg by mouth daily.     fluticasone (FLONASE) 50 MCG/ACT nasal spray Place 2 sprays into both nostrils daily.     ipratropium (ATROVENT) 0.03 % nasal spray SPRAY 2 SPRAYS INTO EACH NOSTRIL THREE TIMES A DAY AS NEEDED (Patient taking differently: Place 2 sprays into both nostrils every morning.)  magnesium oxide (MAG-OX) 400 MG tablet Take 400 mg by mouth daily.     methocarbamol (ROBAXIN) 500 MG tablet Take 500 mg by mouth as needed.     metroNIDAZOLE (METROCREAM) 0.75 % cream APPLY SMALL AMOUNT TO AFFECTED AREA DAILY     Multiple Vitamin (MULTI-VITAMINS) TABS Take by mouth daily.  (Patient taking differently: Take 1 tablet by mouth daily.)     Multiple Vitamins-Minerals (ICAPS AREDS 2 PO) Take 1 tablet by mouth 2 (two) times daily.     omeprazole  (PRILOSEC) 20 MG capsule Take 1 capsule by mouth daily.     polyethylene glycol (MIRALAX / GLYCOLAX) packet Take 17 g by mouth daily as needed for moderate constipation.     rOPINIRole (REQUIP) 0.25 MG tablet Take 0.25 mg by mouth as needed.     rosuvastatin  (CRESTOR ) 10 MG tablet Take 10 mg by mouth 3 (three) times a week.     sotalol  (BETAPACE ) 80 MG tablet TAKE ONE-HALF TABLET BY MOUTH TWICE WEEKLY 12 tablet 3   potassium gluconate 595 (99 K) MG TABS tablet Take 1 tablet by mouth every morning.     No current facility-administered medications for this encounter.    ROS- All systems are reviewed and negative except as per the HPI above.  Physical Exam: Vitals:   12/28/23 1456  BP: (!) 152/76  Pulse: 65  Weight: 78.1 kg  Height: 5' 8.5 (1.74 m)    GEN: Well nourished, well developed in no acute distress CARDIAC: Regular rate and rhythm, no murmurs, rubs, gallops RESPIRATORY:  Clear to auscultation without rales, wheezing or rhonchi  ABDOMEN: Soft, non-tender, non-distended EXTREMITIES:  No edema; No deformity    Wt Readings from Last 3 Encounters:  12/28/23 78.1 kg  06/24/23 78.5 kg  12/22/22 77.2 kg    EKG today demonstrates  SR Vent. rate 65 BPM PR interval 178 ms QRS duration 82 ms QT/QTcB 412/428 ms   Epic records are reviewed at length today  CHA2DS2-VASc Score = 4  The patient's score is based upon: CHF History: 0 HTN History: 1 Diabetes History: 0 Stroke History: 0 Vascular Disease History: 1 Age Score: 2 Gender Score: 0       ASSESSMENT AND PLAN: Persistent Atrial Fibrillation (ICD10:  I48.19) The patient's CHA2DS2-VASc score is 4, indicating a 4.8% annual risk of stroke.   Patient appears to be maintaining SR Continue sotalol  40 mg twice weekly Continue Eliquis  2.5 mg BID  Secondary Hypercoagulable State (ICD10:  D68.69) The patient is at significant risk for stroke/thromboembolism based upon his CHA2DS2-VASc Score of 4.  Continue Apixaban   (Eliquis ). No bleeding issues.   High Risk Medication Monitoring (ICD 10: [id number]) Patient requires ongoing monitoring for anti-arrhythmic medication which has the potential to cause life threatening arrhythmias. QT interval on ECG acceptable for sotalol  monitoring. Check bmet/mag today.     HTN Mildly elevated today. He did not tolerate higher dose of amlodipine .   CAD No anginal symptoms Followed by Dr George Cain   Follow up in the AF clinic in 6 months. Patient due for visit with Dr George Cain.    Daril Kicks PA-C Af[facility] [address] [place], [place] [address] [phone] 12/28/2023 3:11 PM

## 2023-12-29 ENCOUNTER — Ambulatory Visit (HOSPITAL_COMMUNITY): Payer: Self-pay | Admitting: Physician Assistant

## 2023-12-29 ENCOUNTER — Other Ambulatory Visit (HOSPITAL_COMMUNITY): Payer: Self-pay | Admitting: *Deleted

## 2023-12-29 LAB — BASIC METABOLIC PANEL WITH GFR
BUN/Creatinine Ratio: 17 (ref 10–24)
BUN: 25 mg/dL (ref 10–36)
CO2: 22 mmol/L (ref 20–29)
Calcium: 9.4 mg/dL (ref 8.6–10.2)
Chloride: 103 mmol/L (ref 96–106)
Creatinine, Ser: 1.51 mg/dL — ABNORMAL HIGH (ref 0.76–1.27)
Glucose: 90 mg/dL (ref 70–99)
Potassium: 4.2 mmol/L (ref 3.5–5.2)
Sodium: 139 mmol/L (ref 134–144)
eGFR: 43 mL/min/1.73 — ABNORMAL LOW (ref >59)

## 2023-12-29 LAB — MAGNESIUM: Magnesium: 2.2 mg/dL (ref 1.6–2.3)

## 2023-12-29 MED ORDER — SOTALOL HCL 80 MG PO TABS: 40.0000 mg | ORAL_TABLET | ORAL | 6 refills | Status: AC

## 2024-01-04 DIAGNOSIS — H6123 Impacted cerumen, bilateral: Secondary | ICD-10-CM | POA: Diagnosis not present

## 2024-01-04 DIAGNOSIS — Z974 Presence of external hearing-aid: Secondary | ICD-10-CM | POA: Diagnosis not present

## 2024-02-03 DIAGNOSIS — Z23 Encounter for immunization: Secondary | ICD-10-CM | POA: Diagnosis not present

## 2024-03-01 DIAGNOSIS — B351 Tinea unguium: Secondary | ICD-10-CM | POA: Diagnosis not present

## 2024-04-11 ENCOUNTER — Telehealth (HOSPITAL_BASED_OUTPATIENT_CLINIC_OR_DEPARTMENT_OTHER): Payer: Self-pay

## 2024-04-11 NOTE — Telephone Encounter (Signed)
° °  Pre-operative Risk Assessment    Patient Name: George Cain  DOB: 1929/10/21 MRN: [id number]   Date of last office visit: 11/22/2022 with Dr. Shlomo Date of next office visit: 04/27/2024 with Dr. Shlomo  Request for Surgical Clearance    Procedure:  One crown and one filling  Date of Surgery:  Clearance TBD                                 Surgeon:  Leita Curly, DDS Surgeon's Group or Practice Name:  [facility] Phone number:  [phone] Fax number:  [phone]   Type of Clearance Requested:   - Medical  - Pharmacy:  Hold Apixaban  (Eliquis ) -does not specify   Type of Anesthesia:  local with epinephrine   Additional requests/questions:  None  Signed, Patrcia Iverson CROME   04/11/2024, 11:33 AM

## 2024-04-11 NOTE — Telephone Encounter (Signed)
 Pt agrees to have preop eval during appt with Dr. Shlomo on 04/28/23

## 2024-04-11 NOTE — Telephone Encounter (Signed)
° °  Name: George Cain  DOB: 03/22/1930  MRN: [id number]  Primary Cardiologist: Wilbert Bihari, MD  Chart reviewed as part of pre-operative protocol coverage. Because of George Cain's past medical history and time since last visit, he will require a follow-up in-office visit in order to better assess preoperative cardiovascular risk. Dental procedure is typically low risk, but patient has not been seen since 10/2022. Afib clinic also indicated at 12/2023 follow-up that patient was due for follow-up with Dr. Bihari.   Pre-op covering staff: - Please schedule appointment and call patient to inform them. If patient already had an upcoming appointment within acceptable timeframe, please add pre-op clearance to the appointment notes so provider is aware. Scheduled with Dr. Bihari for 04/28/23 - please find out if this timeframe is acceptable for procedure. If so, please add pre-op eval to appt notes. - Please contact requesting surgeon's office via preferred method (i.e, phone, fax) to inform them of need for appointment prior to surgery.  Regarding anticoagulation, would not expect to have to hold for 1 filling/crown but will route to pharm given advanced age and need for dual procedure. No SBE ppx needs based on chart review.  Sherah Lund N Manning Luna, PA-C  04/11/2024, 11:44 AM

## 2024-04-27 ENCOUNTER — Encounter: Payer: Self-pay | Admitting: Cardiology

## 2024-04-27 ENCOUNTER — Ambulatory Visit: Attending: Cardiology | Admitting: Cardiology

## 2024-04-27 VITALS — BP 130/42 | HR 60 | Ht 68.0 in | Wt 170.8 lb

## 2024-04-27 DIAGNOSIS — I480 Paroxysmal atrial fibrillation: Secondary | ICD-10-CM | POA: Diagnosis present

## 2024-04-27 DIAGNOSIS — I10 Essential (primary) hypertension: Secondary | ICD-10-CM | POA: Diagnosis not present

## 2024-04-27 DIAGNOSIS — E785 Hyperlipidemia, unspecified: Secondary | ICD-10-CM | POA: Insufficient documentation

## 2024-04-27 DIAGNOSIS — I2510 Atherosclerotic heart disease of native coronary artery without angina pectoris: Secondary | ICD-10-CM | POA: Diagnosis present

## 2024-04-27 LAB — CBC
Hematocrit: 37.8 % (ref 37.5–51.0)
Hemoglobin: 12.3 g/dL — ABNORMAL LOW (ref 13.0–17.7)
MCH: 31.2 pg (ref 26.6–33.0)
MCHC: 32.5 g/dL (ref 31.5–35.7)
MCV: 96 fL (ref 79–97)
Platelets: 216 x10E3/uL (ref 150–450)
RBC: 3.94 x10E6/uL — ABNORMAL LOW (ref 4.14–5.80)
RDW: 11.3 % — ABNORMAL LOW (ref 11.6–15.4)
WBC: 7.2 x10E3/uL (ref 3.4–10.8)

## 2024-04-27 NOTE — Progress Notes (Signed)
 " Cardiology Office Note:    Date:  04/27/2024   ID:  George Cain, DOB 05-Nov-1929, MRN [id number]  PCP:  Sun, Vyvyan, MD  Cardiologist:  Wilbert Bihari, MD    Referring MD: Sun, Vyvyan, MD   Chief Complaint  Patient presents with   Coronary Artery Disease   Hypertension   Hyperlipidemia   Atrial Fibrillation    History of Present Illness:    George Cain is a 89 y.o. male with a hx of PAF s/p RFA 07/2012, nonobstructive ASCAD with 20% LAD by cath 2011, HTN and dyslipidemia.  He has declined anticoagulation in the past.  He was seen by Dr. Fernande due to bradycardia on Sotolol and the sotolol was stopped.  He continued to refuse anticoagulation.  He started to have palpitations again and he restarted Sotolol at 40mg  weekly and eventually went on Eliquis  5mg  BID.  He is followed in A-fib clinic.  Patient is here today for follow-up and is doing well.  He denies any chest pain or pressure, SOB, DOE, PND, orthopnea, lower extreme edema, dizziness, palpitations or syncope.  Past Medical History:  Diagnosis Date   Allergic rhinitis    Benign essential HTN    BPH (benign prostatic hypertrophy)    Coronary artery disease 2011   nonobstructive ASCAD with 20% LAD s   Hyperlipidemia    Patient refused statins   PAF (paroxysmal atrial fibrillation) (HCC)    s/p RFA 2014.    Past Surgical History:  Procedure Laterality Date   CARDIAC CATHETERIZATION  2011   Nonobstructive ASCAD w 20% LAD   COLONOSCOPY  06/15/2010   RADIOFREQUENCY ABLATION  08/23/12   TONSILLECTOMY  1940   as child    Current Medications: Current Meds  Medication Sig   acetaminophen (TYLENOL) 500 MG tablet Take 1,000 mg by mouth at bedtime.   amLODipine  (NORVASC ) 2.5 MG tablet Take 2.5 mg by mouth daily.   apixaban  (ELIQUIS ) 5 MG TABS tablet TAKE ONE-HALF TABLET BY MOUTH TWICE A DAY (CAUTION: BLOOD THINNER)*APPROVED*   B Complex-C (SUPER B COMPLEX/VITAMIN C) TABS Take 1 tablet by mouth daily.    carboxymethylcellulose (REFRESH PLUS) 0.5 % SOLN Place 1 drop into both eyes in the morning and at bedtime.   cetirizine (ZYRTEC) 10 MG tablet Take 10 mg by mouth daily.    cyclobenzaprine (FLEXERIL) 10 MG tablet Take 10 mg by mouth as needed for muscle spasms.   docusate sodium (COLACE) 100 MG capsule Take 100 mg by mouth every morning.   doxycycline  (VIBRAMYCIN ) 50 MG capsule Take 50 mg by mouth as needed (for Rosacea).   Ferrous Sulfate (IRON) 325 (65 Fe) MG TABS Take 1 tablet by mouth every morning.   finasteride (PROSCAR) 5 MG tablet Take 5 mg by mouth daily.   fluticasone (FLONASE) 50 MCG/ACT nasal spray Place 2 sprays into both nostrils daily.   ipratropium (ATROVENT) 0.03 % nasal spray SPRAY 2 SPRAYS INTO EACH NOSTRIL THREE TIMES A DAY AS NEEDED (Patient taking differently: Place 2 sprays into both nostrils every morning.)   magnesium oxide (MAG-OX) 400 MG tablet Take 400 mg by mouth daily.   methocarbamol (ROBAXIN) 500 MG tablet Take 500 mg by mouth as needed.   metroNIDAZOLE (METROCREAM) 0.75 % cream APPLY SMALL AMOUNT TO AFFECTED AREA DAILY   Multiple Vitamin (MULTI-VITAMINS) TABS Take by mouth daily.  (Patient taking differently: Take 1 tablet by mouth daily.)   Multiple Vitamins-Minerals (ICAPS AREDS 2 PO) Take 1 tablet by mouth 2 (two) times  daily.   omeprazole (PRILOSEC) 20 MG capsule Take 1 capsule by mouth daily.   polyethylene glycol (MIRALAX / GLYCOLAX) packet Take 17 g by mouth daily as needed for moderate constipation.   potassium gluconate 595 (99 K) MG TABS tablet Take 1 tablet by mouth every morning.   rOPINIRole (REQUIP) 0.25 MG tablet Take 0.25 mg by mouth as needed.   rosuvastatin  (CRESTOR ) 10 MG tablet Take 10 mg by mouth 3 (three) times a week.   sotalol  (BETAPACE ) 80 MG tablet Take 0.5 tablets (40 mg total) by mouth 2 (two) times a week.     Allergies:   Cephalexin   Social History   Socioeconomic History   Marital status: Married    Spouse name: Not on file    Number of children: Not on file   Years of education: Not on file   Highest education level: Not on file  Occupational History   Not on file  Tobacco Use   Smoking status: Never   Smokeless tobacco: Never   Tobacco comments:    Never smoke 07/22/21  Vaping Use   Vaping status: Never Used  Substance and Sexual Activity   Alcohol use: Yes    Alcohol/week: 7.0 standard drinks of alcohol    Types: 7 Cans of beer per week    Comment: 1 beer daily 06/23/21   Drug use: No   Sexual activity: Not on file  Other Topics Concern   Not on file  Social History Narrative   Not on file   Social Drivers of Health   Tobacco Use: Low Risk (04/27/2024)   Patient History    Smoking Tobacco Use: Never    Smokeless Tobacco Use: Never    Passive Exposure: Not on file  Financial Resource Strain: Not on file  Food Insecurity: Not on file  Transportation Needs: Not on file  Physical Activity: Not on file  Stress: Not on file  Social Connections: Not on file  Depression (EYV7-0): Not on file  Alcohol Screen: Not on file  Housing: Not on file  Utilities: Not on file  Health Literacy: Not on file     Family History: The patient's family history includes CAD in his father; Diabetes in his father; Heart attack in his father; Hypertension in his mother; Prostate cancer in his father.  ROS:   Please see the history of present illness.    ROS  All other systems reviewed and negative.   EKGs/Labs/Other Studies Reviewed:    The following studies were reviewed today: Outside labs on [facility] EKG Interpretation Date/Time:  Friday April 27 2024 13:29:53 EST Ventricular Rate:  60 PR Interval:  210 QRS Duration:  88 QT Interval:  422 QTC Calculation: 422 R Axis:   33  Text Interpretation: Sinus rhythm with 1st degree A-V block When compared with ECG of 28-Dec-2023 15:07, PR interval has increased Confirmed by Shlomo Corning ([id number]) on 04/27/2024 1:47:24 PM    Recent Labs: 12/28/2023: BUN 25;  Creatinine, Ser 1.51; Magnesium 2.2; Potassium 4.2; Sodium 139   Recent Lipid Panel    Component Value Date/Time   CHOL 176 09/25/2015 1011   TRIG 111 09/25/2015 1011   HDL 68 09/25/2015 1011   CHOLHDL 2.6 09/25/2015 1011   VLDL 22 09/25/2015 1011   LDLCALC 86 09/25/2015 1011    Physical Exam:    VS:  BP (!) 130/42   Pulse 60   Ht 5' 8 (1.727 m)   Wt 170 lb 12.8 oz (77.5 kg)  SpO2 94%   BMI 25.97 kg/m     Wt Readings from Last 3 Encounters:  04/27/24 170 lb 12.8 oz (77.5 kg)  12/28/23 172 lb 3.2 oz (78.1 kg)  06/24/23 173 lb (78.5 kg)    GEN: Well nourished, well developed in no acute distress HEENT: Normal NECK: No JVD; No carotid bruits LYMPHATICS: No lymphadenopathy CARDIAC:RRR, no murmurs, rubs, gallops RESPIRATORY:  Clear to auscultation without rales, wheezing or rhonchi  ABDOMEN: Soft, non-tender, non-distended MUSCULOSKELETAL:  trace LLE edema; No deformity  SKIN: Warm and dry NEUROLOGIC:  Alert and oriented x 3 PSYCHIATRIC:  Normal affect  ASSESSMENT:    1. Coronary artery disease involving native coronary artery of native heart without angina pectoris   2. Dyslipidemia   3. Paroxysmal atrial fibrillation (HCC)   4. Benign essential HTN     PLAN:    In order of problems listed above:  1.  ASCAD -nonobstructive ASCAD with 20% LAD by cath 2011.   -He denies any anginal symptoms since I saw him last -no ASA due to DOAC -he has refused statin therapy -No beta-blocker due to bradycardia in the past  2.  PAF  - S/P remote RFA 2014 by EP - He is maintaining normal sinus rhythm on exam today and denies any palpitations - EKG done in the office today shows sinus rhythm with first-degree AV block and QTc of 422 ms - Continue Eliquis  2.5 mg twice daily (dosed for age> 80 and SCr > 1.5) and Betapace  40 mg twice daily - No bleeding issues on DOAC - I have personally reviewed and interpreted outside labs performed by patient's PCP which showed serum  creatinine 1.5, potassium 4.2 on 12/2023 - Check CBC  3.  HLD -LDL goal is < 70 but he has refused statin therapy  4.  HTN - BP controlled on exam today  - Continue amlodipine  2.5 mg daily with as needed refills   followup with me in 1 year  Medication Adjustments/Labs and Tests Ordered: Current medicines are reviewed at length with the patient today.  Concerns regarding medicines are outlined above.  Orders Placed This Encounter  Procedures   EKG 12-Lead   No orders of the defined types were placed in this encounter.   Signed, Wilbert Bihari, MD  04/27/2024 1:48 PM    [facility] "

## 2024-04-27 NOTE — Addendum Note (Signed)
 Addended by: Taleshia Luff A on: 04/27/2024 01:57 PM   Modules accepted: Orders

## 2024-04-27 NOTE — Patient Instructions (Addendum)
 Medication Instructions:  Your physician recommends that you continue on your current medications as directed. Please refer to the Current Medication list given to you today.  *If you need a refill on your cardiac medications before your next appointment, please call your pharmacy*  Lab Work: TODAY: CBC If you have labs (blood work) drawn today and your tests are completely normal, you will receive your results only by: MyChart Message (if you have MyChart) OR A paper copy in the mail If you have any lab test that is abnormal or we need to change your treatment, we will call you to review the results.  Testing/Procedures: NONE  Follow-Up: At [facility], you and your health needs are our priority.  As part of our continuing mission to provide you with exceptional heart care, our providers are all part of one team.  This team includes your primary Cardiologist (physician) and Advanced Practice Providers or APPs (Physician Assistants and Nurse Practitioners) who all work together to provide you with the care you need, when you need it.  Your next appointment:   1 year(s)  Provider:   Wilbert Bihari, MD

## 2024-04-28 ENCOUNTER — Ambulatory Visit: Payer: Self-pay | Admitting: Cardiology

## 2024-05-01 NOTE — Telephone Encounter (Signed)
-----   Message from Wilbert Bihari, MD sent at 04/28/2024  6:46 PM EST ----- Stable labs - continue current meds and forward to PCP

## 2024-05-01 NOTE — Telephone Encounter (Signed)
 Call to patient to advise of normal labs, patient verbalizes understanding and agrees to continue current meds. Results forwarded to PCP.

## 2024-05-16 NOTE — Telephone Encounter (Signed)
" ° °  Name: George Cain  DOB: April 29, 1929  MRN: [id number]   Primary Cardiologist: Wilbert Bihari, MD  Chart reviewed as part of pre-operative protocol coverage. George Cain was last seen on 04/27/2024 by Dr. Bihari.  He had no complaints of angina or other cardiac concerns. No medication changes were made at that time.   Therefore, based on ACC/AHA guidelines, the patient would be an acceptable risk for the planned procedure without further cardiovascular testing.   Per Pharm D, Eliquis  hold is not indicated. Patient does not need SBE prophylaxis.    I will route this recommendation to the requesting party via Epic fax function and remove from pre-op pool. Please call with questions.  Barnie Hila, NP 05/16/2024, 4:39 PM  "

## 2024-05-16 NOTE — Telephone Encounter (Signed)
 Received another clearance from the office of Leita Curly, DDS. After initial clearance was received 04/11/24, it was determined that the patient needed an in-person f/u with Dr. Shlomo. Pt had OV with Dr. Shlomo 04/27/2024 but I do not see where preop clearance was addressed. Please advice.

## 2024-05-24 NOTE — Telephone Encounter (Signed)
 Requesting office asking clearance be faxed to their office.  Fax number:  [phone]

## 2024-06-26 ENCOUNTER — Ambulatory Visit (HOSPITAL_COMMUNITY): Admitting: Physician Assistant
